# Patient Record
Sex: Male | Born: 1935 | Race: White | Hispanic: No | Marital: Single | Smoking: Former smoker
Health system: Southern US, Community
[De-identification: ages and names within clinical notes are randomized; demographics above are authoritative.]

## PROBLEM LIST (undated history)

## (undated) DIAGNOSIS — N179 Acute kidney failure, unspecified: Secondary | ICD-10-CM

## (undated) DIAGNOSIS — I714 Abdominal aortic aneurysm, without rupture, unspecified: Secondary | ICD-10-CM

## (undated) DIAGNOSIS — J449 Chronic obstructive pulmonary disease, unspecified: Secondary | ICD-10-CM

## (undated) DIAGNOSIS — K56609 Unspecified intestinal obstruction, unspecified as to partial versus complete obstruction: Secondary | ICD-10-CM

## (undated) DIAGNOSIS — J45909 Unspecified asthma, uncomplicated: Secondary | ICD-10-CM

## (undated) DIAGNOSIS — I4891 Unspecified atrial fibrillation: Secondary | ICD-10-CM

## (undated) DIAGNOSIS — Z720 Tobacco use: Secondary | ICD-10-CM

## (undated) DIAGNOSIS — F32A Depression, unspecified: Secondary | ICD-10-CM

## (undated) DIAGNOSIS — R9431 Abnormal electrocardiogram [ECG] [EKG]: Secondary | ICD-10-CM

## (undated) DIAGNOSIS — R112 Nausea with vomiting, unspecified: Secondary | ICD-10-CM

## (undated) DIAGNOSIS — R32 Unspecified urinary incontinence: Secondary | ICD-10-CM

## (undated) DIAGNOSIS — F0390 Unspecified dementia without behavioral disturbance: Secondary | ICD-10-CM

## (undated) DIAGNOSIS — M1990 Unspecified osteoarthritis, unspecified site: Secondary | ICD-10-CM

## (undated) DIAGNOSIS — F329 Major depressive disorder, single episode, unspecified: Secondary | ICD-10-CM

## (undated) DIAGNOSIS — R7989 Other specified abnormal findings of blood chemistry: Secondary | ICD-10-CM

## (undated) DIAGNOSIS — I2781 Cor pulmonale (chronic): Secondary | ICD-10-CM

## (undated) HISTORY — DX: Unspecified osteoarthritis, unspecified site: M19.90

## (undated) HISTORY — DX: Unspecified asthma, uncomplicated: J45.909

## (undated) HISTORY — DX: Depression, unspecified: F32.A

## (undated) HISTORY — DX: Unspecified dementia, unspecified severity, without behavioral disturbance, psychotic disturbance, mood disturbance, and anxiety: F03.90

## (undated) HISTORY — DX: Unspecified urinary incontinence: R32

## (undated) HISTORY — DX: Unspecified intestinal obstruction, unspecified as to partial versus complete obstruction: K56.609

## (undated) HISTORY — DX: Cor pulmonale (chronic): I27.81

## (undated) HISTORY — DX: Abdominal aortic aneurysm, without rupture: I71.4

## (undated) HISTORY — DX: Abdominal aortic aneurysm, without rupture, unspecified: I71.40

## (undated) HISTORY — DX: Acute kidney failure, unspecified: N17.9

## (undated) HISTORY — DX: Tobacco use: Z72.0

## (undated) HISTORY — DX: Major depressive disorder, single episode, unspecified: F32.9

## (undated) HISTORY — DX: Nausea with vomiting, unspecified: R11.2

## (undated) HISTORY — DX: Chronic obstructive pulmonary disease, unspecified: J44.9

---

## 2015-09-17 DIAGNOSIS — J449 Chronic obstructive pulmonary disease, unspecified: Secondary | ICD-10-CM | POA: Diagnosis not present

## 2015-09-17 DIAGNOSIS — R5382 Chronic fatigue, unspecified: Secondary | ICD-10-CM | POA: Diagnosis not present

## 2015-09-17 DIAGNOSIS — H1011 Acute atopic conjunctivitis, right eye: Secondary | ICD-10-CM | POA: Diagnosis not present

## 2015-09-17 DIAGNOSIS — F0390 Unspecified dementia without behavioral disturbance: Secondary | ICD-10-CM | POA: Diagnosis not present

## 2015-09-17 DIAGNOSIS — M1990 Unspecified osteoarthritis, unspecified site: Secondary | ICD-10-CM | POA: Diagnosis not present

## 2015-09-17 DIAGNOSIS — R5383 Other fatigue: Secondary | ICD-10-CM | POA: Diagnosis not present

## 2015-09-17 DIAGNOSIS — H1013 Acute atopic conjunctivitis, bilateral: Secondary | ICD-10-CM | POA: Diagnosis not present

## 2015-09-17 DIAGNOSIS — R05 Cough: Secondary | ICD-10-CM | POA: Diagnosis not present

## 2015-10-23 ENCOUNTER — Encounter (HOSPITAL_COMMUNITY): Payer: Self-pay | Admitting: Emergency Medicine

## 2015-10-23 ENCOUNTER — Ambulatory Visit (INDEPENDENT_AMBULATORY_CARE_PROVIDER_SITE_OTHER): Payer: Medicare Other

## 2015-10-23 ENCOUNTER — Emergency Department (HOSPITAL_COMMUNITY): Payer: Medicare Other

## 2015-10-23 ENCOUNTER — Inpatient Hospital Stay (HOSPITAL_COMMUNITY): Payer: Medicare Other

## 2015-10-23 ENCOUNTER — Inpatient Hospital Stay (HOSPITAL_COMMUNITY)
Admission: EM | Admit: 2015-10-23 | Discharge: 2015-10-28 | DRG: 389 | Disposition: A | Discharge status: Skilled Nursing Facility | Payer: Medicare Other | Attending: Family Medicine | Admitting: Family Medicine

## 2015-10-23 ENCOUNTER — Ambulatory Visit (INDEPENDENT_AMBULATORY_CARE_PROVIDER_SITE_OTHER): Payer: Medicare Other | Admitting: Physician Assistant

## 2015-10-23 VITALS — BP 122/89 | HR 132 | Temp 97.8°F | Ht 65.0 in | Wt 124.0 lb

## 2015-10-23 DIAGNOSIS — Z789 Other specified health status: Secondary | ICD-10-CM | POA: Diagnosis not present

## 2015-10-23 DIAGNOSIS — R52 Pain, unspecified: Secondary | ICD-10-CM

## 2015-10-23 DIAGNOSIS — R1084 Generalized abdominal pain: Secondary | ICD-10-CM

## 2015-10-23 DIAGNOSIS — F329 Major depressive disorder, single episode, unspecified: Secondary | ICD-10-CM | POA: Diagnosis not present

## 2015-10-23 DIAGNOSIS — E872 Acidosis, unspecified: Secondary | ICD-10-CM

## 2015-10-23 DIAGNOSIS — K219 Gastro-esophageal reflux disease without esophagitis: Secondary | ICD-10-CM | POA: Diagnosis not present

## 2015-10-23 DIAGNOSIS — I4891 Unspecified atrial fibrillation: Secondary | ICD-10-CM

## 2015-10-23 DIAGNOSIS — I714 Abdominal aortic aneurysm, without rupture, unspecified: Secondary | ICD-10-CM | POA: Diagnosis present

## 2015-10-23 DIAGNOSIS — R631 Polydipsia: Secondary | ICD-10-CM | POA: Diagnosis not present

## 2015-10-23 DIAGNOSIS — R109 Unspecified abdominal pain: Secondary | ICD-10-CM | POA: Diagnosis not present

## 2015-10-23 DIAGNOSIS — F0390 Unspecified dementia without behavioral disturbance: Secondary | ICD-10-CM | POA: Insufficient documentation

## 2015-10-23 DIAGNOSIS — R112 Nausea with vomiting, unspecified: Secondary | ICD-10-CM

## 2015-10-23 DIAGNOSIS — Z720 Tobacco use: Secondary | ICD-10-CM | POA: Diagnosis present

## 2015-10-23 DIAGNOSIS — R739 Hyperglycemia, unspecified: Secondary | ICD-10-CM | POA: Diagnosis not present

## 2015-10-23 DIAGNOSIS — R110 Nausea: Secondary | ICD-10-CM

## 2015-10-23 DIAGNOSIS — K565 Intestinal adhesions [bands] with obstruction (postprocedural) (postinfection): Secondary | ICD-10-CM | POA: Diagnosis not present

## 2015-10-23 DIAGNOSIS — G4700 Insomnia, unspecified: Secondary | ICD-10-CM | POA: Insufficient documentation

## 2015-10-23 DIAGNOSIS — Z01810 Encounter for preprocedural cardiovascular examination: Secondary | ICD-10-CM | POA: Diagnosis not present

## 2015-10-23 DIAGNOSIS — E860 Dehydration: Secondary | ICD-10-CM | POA: Diagnosis not present

## 2015-10-23 DIAGNOSIS — I744 Embolism and thrombosis of arteries of extremities, unspecified: Secondary | ICD-10-CM | POA: Diagnosis not present

## 2015-10-23 DIAGNOSIS — N390 Urinary tract infection, site not specified: Secondary | ICD-10-CM | POA: Diagnosis present

## 2015-10-23 DIAGNOSIS — I272 Other secondary pulmonary hypertension: Secondary | ICD-10-CM | POA: Diagnosis not present

## 2015-10-23 DIAGNOSIS — I2781 Cor pulmonale (chronic): Secondary | ICD-10-CM | POA: Insufficient documentation

## 2015-10-23 DIAGNOSIS — F17200 Nicotine dependence, unspecified, uncomplicated: Secondary | ICD-10-CM | POA: Insufficient documentation

## 2015-10-23 DIAGNOSIS — F17210 Nicotine dependence, cigarettes, uncomplicated: Secondary | ICD-10-CM | POA: Diagnosis present

## 2015-10-23 DIAGNOSIS — R4182 Altered mental status, unspecified: Secondary | ICD-10-CM

## 2015-10-23 DIAGNOSIS — D72829 Elevated white blood cell count, unspecified: Secondary | ICD-10-CM

## 2015-10-23 DIAGNOSIS — N179 Acute kidney failure, unspecified: Secondary | ICD-10-CM | POA: Diagnosis present

## 2015-10-23 DIAGNOSIS — K5669 Other intestinal obstruction: Secondary | ICD-10-CM | POA: Diagnosis not present

## 2015-10-23 DIAGNOSIS — N401 Enlarged prostate with lower urinary tract symptoms: Secondary | ICD-10-CM | POA: Diagnosis not present

## 2015-10-23 DIAGNOSIS — R079 Chest pain, unspecified: Secondary | ICD-10-CM | POA: Diagnosis not present

## 2015-10-23 DIAGNOSIS — A419 Sepsis, unspecified organism: Secondary | ICD-10-CM

## 2015-10-23 DIAGNOSIS — K5660 Unspecified intestinal obstruction: Secondary | ICD-10-CM | POA: Diagnosis not present

## 2015-10-23 DIAGNOSIS — R7989 Other specified abnormal findings of blood chemistry: Secondary | ICD-10-CM

## 2015-10-23 DIAGNOSIS — J449 Chronic obstructive pulmonary disease, unspecified: Secondary | ICD-10-CM | POA: Diagnosis present

## 2015-10-23 DIAGNOSIS — R350 Frequency of micturition: Secondary | ICD-10-CM | POA: Insufficient documentation

## 2015-10-23 DIAGNOSIS — K598 Other specified functional intestinal disorders: Secondary | ICD-10-CM | POA: Diagnosis not present

## 2015-10-23 DIAGNOSIS — K56609 Unspecified intestinal obstruction, unspecified as to partial versus complete obstruction: Secondary | ICD-10-CM | POA: Diagnosis present

## 2015-10-23 DIAGNOSIS — R9431 Abnormal electrocardiogram [ECG] [EKG]: Secondary | ICD-10-CM | POA: Diagnosis not present

## 2015-10-23 DIAGNOSIS — I480 Paroxysmal atrial fibrillation: Secondary | ICD-10-CM | POA: Diagnosis not present

## 2015-10-23 DIAGNOSIS — R9439 Abnormal result of other cardiovascular function study: Secondary | ICD-10-CM | POA: Diagnosis not present

## 2015-10-23 DIAGNOSIS — E876 Hypokalemia: Secondary | ICD-10-CM | POA: Diagnosis not present

## 2015-10-23 DIAGNOSIS — I509 Heart failure, unspecified: Secondary | ICD-10-CM | POA: Diagnosis not present

## 2015-10-23 DIAGNOSIS — R778 Other specified abnormalities of plasma proteins: Secondary | ICD-10-CM | POA: Diagnosis present

## 2015-10-23 DIAGNOSIS — K2970 Gastritis, unspecified, without bleeding: Secondary | ICD-10-CM | POA: Diagnosis not present

## 2015-10-23 HISTORY — DX: Other specified abnormal findings of blood chemistry: R79.89

## 2015-10-23 HISTORY — DX: Unspecified atrial fibrillation: I48.91

## 2015-10-23 HISTORY — DX: Abnormal electrocardiogram (ECG) (EKG): R94.31

## 2015-10-23 LAB — COMPREHENSIVE METABOLIC PANEL
ALT: 12 U/L — AB (ref 17–63)
AST: 25 U/L (ref 15–41)
Albumin: 4.1 g/dL (ref 3.5–5.0)
Alkaline Phosphatase: 75 U/L (ref 38–126)
Anion gap: 23 — ABNORMAL HIGH (ref 5–15)
BUN: 86 mg/dL — ABNORMAL HIGH (ref 6–20)
CHLORIDE: 85 mmol/L — AB (ref 101–111)
CO2: 25 mmol/L (ref 22–32)
CREATININE: 3.39 mg/dL — AB (ref 0.61–1.24)
Calcium: 9.7 mg/dL (ref 8.9–10.3)
GFR calc non Af Amer: 15 mL/min — ABNORMAL LOW (ref >60)
GFR, EST AFRICAN AMERICAN: 18 mL/min — AB (ref >60)
Glucose, Bld: 149 mg/dL — ABNORMAL HIGH (ref 65–99)
POTASSIUM: 3.8 mmol/L (ref 3.5–5.1)
SODIUM: 133 mmol/L — AB (ref 135–145)
Total Bilirubin: 2.2 mg/dL — ABNORMAL HIGH (ref 0.3–1.2)
Total Protein: 7.2 g/dL (ref 6.5–8.1)

## 2015-10-23 LAB — I-STAT CG4 LACTIC ACID, ED
LACTIC ACID, VENOUS: 3.35 mmol/L — AB (ref 0.5–2.0)
Lactic Acid, Venous: 3.76 mmol/L (ref 0.5–2.0)

## 2015-10-23 LAB — URINALYSIS, ROUTINE W REFLEX MICROSCOPIC
Bilirubin Urine: NEGATIVE
GLUCOSE, UA: NEGATIVE mg/dL
HGB URINE DIPSTICK: NEGATIVE
Ketones, ur: NEGATIVE mg/dL
Nitrite: NEGATIVE
PH: 5 (ref 5.0–8.0)
Protein, ur: NEGATIVE mg/dL
SPECIFIC GRAVITY, URINE: 1.02 (ref 1.005–1.030)

## 2015-10-23 LAB — URINE MICROSCOPIC-ADD ON

## 2015-10-23 LAB — POCT CBC
GRANULOCYTE PERCENT: 91.8 % — AB (ref 37–80)
HEMATOCRIT: 50.4 % (ref 43.5–53.7)
Hemoglobin: 17.7 g/dL (ref 14.1–18.1)
Lymph, poc: 1 (ref 0.6–3.4)
MCH: 32.2 pg — AB (ref 27–31.2)
MCHC: 35.2 g/dL (ref 31.8–35.4)
MCV: 91.6 fL (ref 80–97)
MID (CBC): 0.7 (ref 0–0.9)
MPV: 8.3 fL (ref 0–99.8)
PLATELET COUNT, POC: 237 10*3/uL (ref 142–424)
POC GRANULOCYTE: 18.5 — AB (ref 2–6.9)
POC LYMPH %: 4.9 % — AB (ref 10–50)
POC MID %: 3.3 %M (ref 0–12)
RBC: 5.5 M/uL (ref 4.69–6.13)
RDW, POC: 14.5 %
WBC: 20.2 10*3/uL — AB (ref 4.6–10.2)

## 2015-10-23 LAB — SODIUM, URINE, RANDOM: Sodium, Ur: 15 mmol/L

## 2015-10-23 LAB — CBC
HEMATOCRIT: 51.6 % (ref 39.0–52.0)
HEMOGLOBIN: 17.7 g/dL — AB (ref 13.0–17.0)
MCH: 31.3 pg (ref 26.0–34.0)
MCHC: 34.3 g/dL (ref 30.0–36.0)
MCV: 91.3 fL (ref 78.0–100.0)
PLATELETS: 228 10*3/uL (ref 150–400)
RBC: 5.65 MIL/uL (ref 4.22–5.81)
RDW: 14.6 % (ref 11.5–15.5)
WBC: 19.6 10*3/uL — ABNORMAL HIGH (ref 4.0–10.5)

## 2015-10-23 LAB — I-STAT CHEM 8, ED
BUN: 69 mg/dL — AB (ref 6–20)
CREATININE: 2.6 mg/dL — AB (ref 0.61–1.24)
Calcium, Ion: 0.87 mmol/L — ABNORMAL LOW (ref 1.13–1.30)
Chloride: 95 mmol/L — ABNORMAL LOW (ref 101–111)
Glucose, Bld: 119 mg/dL — ABNORMAL HIGH (ref 65–99)
HEMATOCRIT: 51 % (ref 39.0–52.0)
HEMOGLOBIN: 17.3 g/dL — AB (ref 13.0–17.0)
POTASSIUM: 3.4 mmol/L — AB (ref 3.5–5.1)
SODIUM: 133 mmol/L — AB (ref 135–145)
TCO2: 23 mmol/L (ref 0–100)

## 2015-10-23 LAB — LACTIC ACID, PLASMA
LACTIC ACID, VENOUS: 1.8 mmol/L (ref 0.5–2.0)
Lactic Acid, Venous: 2.1 mmol/L (ref 0.5–2.0)

## 2015-10-23 LAB — TROPONIN I: TROPONIN I: 0.06 ng/mL — AB (ref <0.031)

## 2015-10-23 LAB — GLUCOSE, POCT (MANUAL RESULT ENTRY): POC GLUCOSE: 152 mg/dL — AB (ref 70–99)

## 2015-10-23 LAB — MAGNESIUM: MAGNESIUM: 1.9 mg/dL (ref 1.7–2.4)

## 2015-10-23 LAB — LIPASE, BLOOD: LIPASE: 25 U/L (ref 11–51)

## 2015-10-23 LAB — PHOSPHORUS: PHOSPHORUS: 4.3 mg/dL (ref 2.5–4.6)

## 2015-10-23 LAB — CREATININE, URINE, RANDOM: Creatinine, Urine: 133.94 mg/dL

## 2015-10-23 MED ORDER — DEXTROSE-NACL 5-0.45 % IV SOLN
INTRAVENOUS | Status: AC
  Administered 2015-10-23: via INTRAVENOUS

## 2015-10-23 MED ORDER — ONDANSETRON HCL 4 MG/2ML IJ SOLN: 4.0000 mg | Freq: Four times a day (QID) | INTRAMUSCULAR | Status: DC | PRN

## 2015-10-23 MED ORDER — MORPHINE SULFATE (PF) 2 MG/ML IV SOLN: 2.0000 mg | INTRAVENOUS | Status: DC | PRN

## 2015-10-23 MED ORDER — ONDANSETRON HCL 4 MG/2ML IJ SOLN
4.0000 mg | Freq: Once | INTRAMUSCULAR | Status: AC
  Administered 2015-10-23: 4 mg via INTRAVENOUS
  Filled 2015-10-23: qty 2

## 2015-10-23 MED ORDER — IOHEXOL 300 MG/ML  SOLN
25.0000 mL | Freq: Once | INTRAMUSCULAR | Status: AC | PRN
  Administered 2015-10-23: 25 mL via ORAL

## 2015-10-23 MED ORDER — SODIUM CHLORIDE 0.9 % IV BOLUS (SEPSIS)
1000.0000 mL | INTRAVENOUS | Status: AC
Start: 2015-10-23 — End: 2015-10-23
  Administered 2015-10-23 (×2): 1000 mL via INTRAVENOUS

## 2015-10-23 MED ORDER — PIPERACILLIN-TAZOBACTAM 3.375 G IVPB 30 MIN
3.3750 g | Freq: Once | INTRAVENOUS | Status: AC
  Administered 2015-10-23: 3.375 g via INTRAVENOUS
  Filled 2015-10-23: qty 50

## 2015-10-23 MED ORDER — MORPHINE SULFATE (PF) 4 MG/ML IV SOLN
4.0000 mg | Freq: Once | INTRAVENOUS | Status: AC
  Administered 2015-10-23: 4 mg via INTRAVENOUS
  Filled 2015-10-23: qty 1

## 2015-10-23 MED ORDER — PIPERACILLIN-TAZOBACTAM IN DEX 2-0.25 GM/50ML IV SOLN
2.2500 g | Freq: Three times a day (TID) | INTRAVENOUS | Status: DC
  Administered 2015-10-24 – 2015-10-25 (×5): 2.25 g via INTRAVENOUS
  Filled 2015-10-23 (×6): qty 50

## 2015-10-23 MED ORDER — PANTOPRAZOLE SODIUM 40 MG IV SOLR
40.0000 mg | INTRAVENOUS | Status: DC
  Administered 2015-10-24 – 2015-10-26 (×4): 40 mg via INTRAVENOUS
  Filled 2015-10-23 (×4): qty 40

## 2015-10-23 MED ORDER — ONDANSETRON HCL 4 MG PO TABS: 4.0000 mg | ORAL_TABLET | Freq: Four times a day (QID) | ORAL | Status: DC | PRN

## 2015-10-23 MED ORDER — HEPARIN (PORCINE) IN NACL 100-0.45 UNIT/ML-% IJ SOLN
1300.0000 [IU]/h | INTRAMUSCULAR | Status: DC
  Administered 2015-10-23: 750 [IU]/h via INTRAVENOUS
  Administered 2015-10-24: 1000 [IU]/h via INTRAVENOUS
  Administered 2015-10-26: 950 [IU]/h via INTRAVENOUS
  Filled 2015-10-23 (×3): qty 250

## 2015-10-23 MED ORDER — MAGNESIUM SULFATE IN D5W 10-5 MG/ML-% IV SOLN
1.0000 g | Freq: Once | INTRAVENOUS | Status: AC
  Administered 2015-10-24: 1 g via INTRAVENOUS
  Filled 2015-10-23 (×2): qty 100

## 2015-10-23 MED ORDER — HEPARIN BOLUS VIA INFUSION
2500.0000 [IU] | Freq: Once | INTRAVENOUS | Status: AC
  Administered 2015-10-23: 2500 [IU] via INTRAVENOUS
  Filled 2015-10-23: qty 2500

## 2015-10-23 NOTE — ED Notes (Signed)
Unsuccessful attempt for NGT placement with Vance PeperLynnze, RN as assist.

## 2015-10-23 NOTE — Progress Notes (Signed)
ANTICOAGULATION CONSULT NOTE - Initial Consult  Pharmacy Consult for heparin  Indication: atrial fibrillation  No Known Allergies  Patient Measurements: Weight: 120 lb 3.2 oz (54.522 kg)  Vital Signs: Temp: 97.7 F (36.5 C) (03/11 1630) Temp Source: Oral (03/11 1630) BP: 104/84 mmHg (03/11 2000) Pulse Rate: 102 (03/11 1904)  Labs:  Recent Labs  10/23/15 1421 10/23/15 1628  HGB 17.7 17.7*  HCT 50.4 51.6  PLT  --  228  CREATININE  --  3.39*    Estimated Creatinine Clearance: 12.5 mL/min (by C-G formula based on Cr of 3.39).   Medical History: Past Medical History  Diagnosis Date  . Asthma   . Arthritis   . Depression   . Dementia   . Incontinence     Assessment: [age] yoM admitted with abdominal pain and found to be in afib. Pharmacy consulted to dose heparin. No known anticoagulation PTA, CBC stable.  Goal of Therapy:  Heparin level 0.3-0.7 units/ml Monitor platelets by anticoagulation protocol: Yes   Plan:  1. Give 2500 units bolus x 1 2. Start heparin infusion at 750 units/hr 3. Check anti-Xa level in 8 hours and daily while on heparin 4. Continue to monitor H&H and platelets  Pollyann SamplesAndy Stephen Baruch, PharmD, BCPS 10/23/2015, 8:24 PM Pager: [phone]

## 2015-10-23 NOTE — ED Notes (Signed)
Attempted to assist patient to void, unsucessful. Preparing for in and out cath.

## 2015-10-23 NOTE — Consult Note (Signed)
Reason for Consult:Small bowel obstruction Referring Physician: Kamdyn Colborn is an 82 y.o. male.  HPI: Patient with no known history of prior surgry, acute onset of nausea and vomiting for today and severe abdominal pain which has subsided recentl.  Given pain medications in the ED and also anti-nausea medications.  Surgery called for consultation  Past Medical History  Diagnosis Date  . Asthma   . Arthritis   . Depression   . Dementia   . Incontinence     History reviewed. No pertinent past surgical history.  History reviewed. No pertinent family history.  Social History:  reports that he has been smoking Cigarettes.  He has a 60 pack-year smoking history. He has never used smokeless tobacco. He reports that he does not drink alcohol or use illicit drugs.  Allergies: No Known Allergies  Medications: Patient not on any medication prior to admission  Results for orders placed or performed during the hospital encounter of 10/23/15 (from the past 48 hour(s))  Lipase, blood     Status: None   Collection Time: 10/23/15  4:28 PM  Result Value Ref Range   Lipase 25 11 - 51 U/L  Comprehensive metabolic panel     Status: Abnormal   Collection Time: 10/23/15  4:28 PM  Result Value Ref Range   Sodium 133 (L) 135 - 145 mmol/L   Potassium 3.8 3.5 - 5.1 mmol/L   Chloride 85 (L) 101 - 111 mmol/L   CO2 25 22 - 32 mmol/L   Glucose, Bld 149 (H) 65 - 99 mg/dL   BUN 86 (H) 6 - 20 mg/dL   Creatinine, Ser 3.39 (H) 0.61 - 1.24 mg/dL   Calcium 9.7 8.9 - 10.3 mg/dL   Total Protein 7.2 6.5 - 8.1 g/dL   Albumin 4.1 3.5 - 5.0 g/dL   AST 25 15 - 41 U/L   ALT 12 (L) 17 - 63 U/L   Alkaline Phosphatase 75 38 - 126 U/L   Total Bilirubin 2.2 (H) 0.3 - 1.2 mg/dL   GFR calc non Af Amer 15 (L) >60 mL/min   GFR calc Af Amer 18 (L) >60 mL/min    Comment: (NOTE) The eGFR has been calculated using the CKD EPI equation. This calculation has not been validated in all clinical situations. eGFR's  persistently <60 mL/min signify possible Chronic Kidney Disease.    Anion gap 23 (H) 5 - 15  CBC     Status: Abnormal   Collection Time: 10/23/15  4:28 PM  Result Value Ref Range   WBC 19.6 (H) 4.0 - 10.5 K/uL   RBC 5.65 4.22 - 5.81 MIL/uL   Hemoglobin 17.7 (H) 13.0 - 17.0 g/dL   HCT 51.6 39.0 - 52.0 %   MCV 91.3 78.0 - 100.0 fL   MCH 31.3 26.0 - 34.0 pg   MCHC 34.3 30.0 - 36.0 g/dL   RDW 14.6 11.5 - 15.5 %   Platelets 228 150 - 400 K/uL  I-Stat CG4 Lactic Acid, ED (Not at [facility])     Status: Abnormal   Collection Time: 10/23/15  4:42 PM  Result Value Ref Range   Lactic Acid, Venous 3.76 (HH) 0.5 - 2.0 mmol/L   Comment NOTIFIED PHYSICIAN   I-stat chem 8, ed     Status: Abnormal   Collection Time: 10/23/15  8:24 PM  Result Value Ref Range   Sodium 133 (L) 135 - 145 mmol/L   Potassium 3.4 (L) 3.5 - 5.1 mmol/L   Chloride  95 (L) 101 - 111 mmol/L   BUN 69 (H) 6 - 20 mg/dL   Creatinine, Ser 2.60 (H) 0.61 - 1.24 mg/dL   Glucose, Bld 119 (H) 65 - 99 mg/dL   Calcium, Ion 0.87 (L) 1.13 - 1.30 mmol/L   TCO2 23 0 - 100 mmol/L   Hemoglobin 17.3 (H) 13.0 - 17.0 g/dL   HCT 51.0 39.0 - 52.0 %    Ct Abdomen Pelvis Wo Contrast  10/23/2015  CLINICAL DATA:  Nausea and vomiting with generalized abdominal pain. EXAM: CT ABDOMEN AND PELVIS WITHOUT CONTRAST TECHNIQUE: Multidetector CT imaging of the abdomen and pelvis was performed following the standard protocol without IV contrast. COMPARISON:  Acute abdomen series from earlier today. FINDINGS: Lower chest: Emphysema noted in the lung bases with bronchial wall thickening and dependent atelectasis. Hepatobiliary: No focal abnormality in the liver on this study without intravenous contrast. No evidence of hepatomegaly. Gallbladder is distended. No intrahepatic or extrahepatic biliary dilation. Pancreas: No focal mass lesion. No dilatation of the main duct. No intraparenchymal cyst. No peripancreatic edema. Spleen: No splenomegaly. No focal mass lesion.  Adrenals/Urinary Tract: No adrenal nodule or mass. No renal stones or hydronephrosis. No definite renal mass lesion on this study without intravenous contrast material. No evidence for hydroureter. The urinary bladder appears normal for the degree of distention. Stomach/Bowel: Mild circumferential wall thickening is seen in the distal esophagus and there is a small hiatal hernia. Stomach otherwise unremarkable. Duodenum is normally positioned as is the ligament of Treitz. Proximal jejunal loops are opacified and not substantially dilated. However, mid small bowel is air-filled and dilated up to 4.2 cm in diameter. The distal ileal loops are completely decompressed and the terminal ileum is visible on image 41 series 21. No definite transition zone can be identified. The colon is largely decompressed throughout. No evidence for bowel wall thickening. No pneumatosis. Vascular/Lymphatic: Abdominal aortic atherosclerosis noted with infrarenal abdominal aorta measuring up to 3.4 cm in diameter. There is no gastrohepatic or hepatoduodenal ligament lymphadenopathy. No intraperitoneal or retroperitoneal lymphadenopathy. No pelvic sidewall lymphadenopathy. Reproductive: The prostate gland and seminal vesicles have normal imaging features. Other: No intraperitoneal free fluid. No evidence for interloop mesenteric fluid Musculoskeletal: Patient is status post ORIF for proximal left femur fracture. Bones are diffusely demineralized Bone windows reveal no worrisome lytic or sclerotic osseous lesions. IMPRESSION: 1. Dilated small bowel up to 4.2 cm compatible with small bowel obstruction. The point of obstruction is probably in the mid to distal small bowel although a discrete transition zone cannot be identified on the current exam. There is no associated pneumatosis or bowel wall thickening. No interloop mesenteric fluid. 2. Circumferential wall thickening in the distal esophagus associated with small hiatal hernia. Changes in  the esophagus may be related and reflux. 3. 3.4 cm abdominal aortic aneurysm. Recommend followup by ultrasound in 3 years. This recommendation follows ACR consensus guidelines: White Paper of the ACR Incidental Findings Committee II on Vascular Findings. [facility]iol 2013; 10:789-794 Electronically Signed   By: Misty Stanley M.D.   On: 10/23/2015 18:57   Dg Abd Acute W/chest  10/23/2015  CLINICAL DATA:  82 year old male with history of abdominal pain. EXAM: DG ABDOMEN ACUTE W/ 1V CHEST COMPARISON:  No priors. FINDINGS: Several small dense nodules in the right hemithorax are most compatible with calcified granulomas. Bilateral nipple shadows incidentally noted. No acute consolidative airspace disease. No pleural effusions. No evidence of pulmonary edema. No pneumothorax. Heart size is normal. Upper mediastinal contours  are within normal limits. Atherosclerosis in the thoracic aorta. Numerous dilated loops of small bowel measuring up to 6.3 cm in diameter. There is a small amount of colonic and rectal gas and stool noted. No frank pneumoperitoneum confidently identified. Orthopedic fixation hardware in the left hip incompletely visualize. Numerous vascular calcifications are noted. IMPRESSION: 1. Appearance of the abdomen is compatible with small bowel obstruction, as above. Given the presence of gas and stool in the colon, obstruction may be early, or could be partial. 2. No pneumoperitoneum. 3. No radiographic evidence of acute cardiopulmonary disease. 4. Atherosclerosis. Electronically Signed   By: Vinnie Langton M.D.   On: 10/23/2015 14:39    Review of Systems  Constitutional: Negative for fever and chills.  Eyes: Negative.   Respiratory: Negative.   Cardiovascular: Negative.   Gastrointestinal: Positive for nausea, vomiting and abdominal pain.  Genitourinary: Negative.   Musculoskeletal: Negative.   Skin: Negative.   Neurological: Negative.   Endo/Heme/Allergies: Negative.    Psychiatric/Behavioral: Negative.   All other systems reviewed and are negative.  Blood pressure 100/69, pulse 102, temperature 97.7 F (36.5 C), temperature source Oral, resp. rate 15, weight 54.522 kg (120 lb 3.2 oz), SpO2 96 %. Physical Exam  Constitutional: He appears well-developed and well-nourished.  Slim but not cachetic  HENT:  Head: Normocephalic and atraumatic.  Eyes: Conjunctivae and EOM are normal. Pupils are equal, round, and reactive to light.  Neck: Normal range of motion. Neck supple.  Cardiovascular: An irregularly irregular rhythm present. Tachycardia present.   Pulses:      Carotid pulses are 2+ on the right side, and 2+ on the left side.      Radial pulses are 2+ on the right side, and 2+ on the left side.       Posterior tibial pulses are 2+ on the right side, and 2+ on the left side.  Respiratory: Effort normal and breath sounds normal.  GI: Soft. He exhibits distension. Bowel sounds are decreased. There is no tenderness. There is no rebound and no guarding.  Musculoskeletal: Normal range of motion.  Skin: Skin is warm and dry.  Psychiatric: He has a normal mood and affect. His behavior is normal. Judgment and thought content normal.    Assessment/Plan: New onset atrial fibrillation New onset bowel obstruction without know history of prior operation.  Needs NGT and possibly surgery in the near future.  Although his WBC was elevated and he has a moderately elevated lactic acid level, I do not believe that the patient is septic.  He needs acute resuscitation, IV hydration, pain control, and an NGT.  We will reassess the patient in the AM  [facility] Nimesh Riolo 10/23/2015, 8:51 PM

## 2015-10-23 NOTE — ED Notes (Signed)
Spoke to teresa in regards to repeat lactic.

## 2015-10-23 NOTE — ED Notes (Signed)
[facility] reports pt has bowel obstruction per x-ray.

## 2015-10-23 NOTE — Progress Notes (Signed)
Subjective:    Patient ID: Jesse Barber, male    DOB: 23-Jun-1929, 82 y.o.   MRN: [id number]  Chief Complaint  Patient presents with  . Emesis  . Cough  . Shortness of Breath   HPI  Jesse Barber presents today for an evaluation of 4 days of N, V, and abdominal pain.   He speaks serbian and is accompanied with his son and daughter in law who are translating. PMH of dementia, incontinence, and depression.   Family states that Wed (10/20/15) he complained of a LLQ abdominal pain than RLQ pain. Also states he became very thirsty Wednesday, always asking for water. Has had some incontinence for a few years but family states it has gotten worse the last few days. He tells them he is dry but they can smell he isn't. Pt denies pain with urination.   Family states he hasn't eaten anything since Wednesday. Pt states he's not hungry, just wants water to drink. When he drinks water he gets nauseous and immediately vomits it back up. The vomit looks like water with some yellow/green spots to it. Denies hematemesis. Reports some dizziness after vomiting. Thinks his last bowel movement was yesterday and was normal. Family is unsure if this is correct.  Family also states he's been getting more confused since Wednesday night. They state he has some dementia but that he has gotten much worse the lst few days. He looks off into space or at things that aren't there. He's "saying weird, crazy things."    Family states that sometimes his fingers and his face look yellow.   Denies chest pain. States he has a cough but is normal for him. When asked about SOB patient shakes his head but family says they think he might be.   Smokes a pack a day for 60 years. Had a check up last year and family reports received CXR which was normal.   PMH, FH, and SH were all reviewed with patient and updated as needed.     Prior to Admission medications   Medication Sig Start Date End Date Taking? Authorizing Provider    amitriptyline (ELAVIL) 25 MG tablet Take 25 mg by mouth at bedtime.   Yes Historical Provider, MD  donepezil (ARICEPT) 5 MG tablet Take 5 mg by mouth at bedtime.   Yes Historical Provider, MD  solifenacin (VESICARE) 10 MG tablet Take by mouth daily.   Yes Historical Provider, MD    Review of Systems  Constitutional: Positive for fever.  Respiratory: Positive for shortness of breath. Negative for cough and chest tightness.   Cardiovascular: Negative for chest pain.  Gastrointestinal: Positive for nausea, vomiting, abdominal pain and abdominal distention. Negative for diarrhea and constipation.  Endocrine: Positive for polydipsia and polyuria.  Genitourinary: Negative for dysuria and hematuria.  Neurological: Positive for dizziness and light-headedness. Negative for headaches.       Objective:   Physical Exam  Constitutional: He appears cachectic. He has a sickly appearance. He appears ill.  Blood pressure 122/89, pulse 132, temperature 97.8 F (36.6 C), temperature source Axillary, height 5' 5"  (1.651 m), weight 124 lb (56.246 kg).   HENT:  Head: Normocephalic and atraumatic.  Mouth/Throat: Oropharynx is clear and moist. No oropharyngeal exudate.  Eyes: Conjunctivae and EOM are normal. Pupils are equal, round, and reactive to light. No scleral icterus.  Cardiovascular: An irregularly irregular rhythm present. Tachycardia present.   Pulses:      Radial pulses are 2+ on the right side, and  2+ on the left side.  Difficult to auscultate heart sounds due to rapid breathing.   Pulmonary/Chest: No accessory muscle usage. Tachypnea noted. He has no wheezes. He has no rhonchi. He has no rales.  Abdominal: He exhibits distension. Bowel sounds are decreased. There is tenderness in the right lower quadrant. There is no CVA tenderness.  Abdomen is distended. Diffuse tenderness to palpation but more so in the RLQ. Unable to determine rebound or rosvings due to mental status and language barrier.  Attempt to lie pt flat results in vomiting.   Lymphadenopathy:       Head (right side): No submental, no submandibular, no tonsillar, no preauricular, no posterior auricular and no occipital adenopathy present.       Head (left side): No submental, no submandibular, no tonsillar, no preauricular, no posterior auricular and no occipital adenopathy present.    He has no cervical adenopathy.       Right: No supraclavicular adenopathy present.       Left: No supraclavicular adenopathy present.  Neurological: He is alert. No sensory deficit.  Skin: Skin is warm and dry.  Psychiatric:  Difficult to appreciate due to language barrier.      Results for orders placed or performed in visit on 10/23/15  POCT CBC  Result Value Ref Range   WBC 20.2 (A) 4.6 - 10.2 K/uL   Lymph, poc 1.0 0.6 - 3.4   POC LYMPH PERCENT 4.9 (A) 10 - 50 %L   MID (cbc) 0.7 0 - 0.9   POC MID % 3.3 0 - 12 %M   POC Granulocyte 18.5 (A) 2 - 6.9   Granulocyte percent 91.8 (A) 37 - 80 %G   RBC 5.50 4.69 - 6.13 M/uL   Hemoglobin 17.7 14.1 - 18.1 g/dL   HCT, POC 50.4 43.5 - 53.7 %   MCV 91.6 80 - 97 fL   MCH, POC 32.2 (A) 27 - 31.2 pg   MCHC 35.2 31.8 - 35.4 g/dL   RDW, POC 14.5 %   Platelet Count, POC 237 142 - 424 K/uL   MPV 8.3 0 - 99.8 fL  POCT glucose (manual entry)  Result Value Ref Range   POC Glucose 152 (A) 70 - 99 mg/dl   Dg Abd Acute W/chest  10/23/2015  CLINICAL DATA:  82 year old male with history of abdominal pain. EXAM: DG ABDOMEN ACUTE W/ 1V CHEST COMPARISON:  No priors. FINDINGS: Several small dense nodules in the right hemithorax are most compatible with calcified granulomas. Bilateral nipple shadows incidentally noted. No acute consolidative airspace disease. No pleural effusions. No evidence of pulmonary edema. No pneumothorax. Heart size is normal. Upper mediastinal contours are within normal limits. Atherosclerosis in the thoracic aorta. Numerous dilated loops of small bowel measuring up to 6.3 cm in  diameter. There is a small amount of colonic and rectal gas and stool noted. No frank pneumoperitoneum confidently identified. Orthopedic fixation hardware in the left hip incompletely visualize. Numerous vascular calcifications are noted. IMPRESSION: 1. Appearance of the abdomen is compatible with small bowel obstruction, as above. Given the presence of gas and stool in the colon, obstruction may be early, or could be partial. 2. No pneumoperitoneum. 3. No radiographic evidence of acute cardiopulmonary disease. 4. Atherosclerosis. Electronically Signed   By: Vinnie Langton M.D.   On: 10/23/2015 14:39      Assessment & Plan:  Generalized abdominal pain - Plan: DG Abd Acute W/Chest  Nausea and vomiting, intractability of vomiting not specified, unspecified vomiting  type - Plan: POCT CBC  SBO (small bowel obstruction) (HCC)  Polydipsia - Plan: POCT glucose (manual entry), POCT urinalysis dipstick, POCT Microscopic Urinalysis ([facility])  Hyperglycemia  Altered mental status, unspecified altered mental status type - Plan: EKG 12-Lead, DG Abd Acute W/Chest  Mr. Laviolette is an 81 year old male who presents with a 4 day history of N, V, abdominal pain. Associated with increased confusion, polyuria, polydipsia, and urinary incontinence. Is tachypnic and with irregularly irregular pulse on exam. PE is also pertinent of a distended abdomen, with generalized tenderness and pain worse in the RLQ. CBC shows WBC of 20.2k and a large left shift. POCT glucose was also elevated at 152. EKG is pertinent for new onset atrial fibrilation with other changes noted. Abdominal series xrays are positive for small bowell obstruction.   Will call for transport to the ER via EMS for further evaluation.   Discussed assessment and plan with patient and his family. They expressed their understanding and acceptance.   [place]

## 2015-10-23 NOTE — Patient Instructions (Signed)
     IF you received an x-ray today, you will receive an invoice from [facility]. Please contact [facility] at [phone] with questions or concerns regarding your invoice.   IF you received labwork today, you will receive an invoice from [facility]. Please contact [facility] at [phone] with questions or concerns regarding your invoice.   Our billing staff will not be able to assist you with questions regarding bills from these companies.  You will be contacted with the lab results as soon as they are available. The fastest way to get your results is to activate your My Chart account. Instructions are located on the last page of this paperwork. If you have not heard from us regarding the results in 2 weeks, please contact this office.      

## 2015-10-23 NOTE — ED Notes (Signed)
Code sepsis 

## 2015-10-23 NOTE — ED Provider Notes (Signed)
CSN: [id number]     Arrival date & time 10/23/15  1531 History   First MD Initiated Contact with Patient 10/23/15 1540     Chief Complaint  Patient presents with  . Abdominal Pain  . Emesis     (Consider location/radiation/quality/duration/timing/severity/associated sxs/prior Treatment) HPI Comments: Pt comes in with cc of abd pain. Pt speaks [place], family helping - Nurse, learning disabilitytranslator utilized. PT reports that he started having abd pain to the R side and lower quadrants on Wednesday. The pain was off and on, but now is constant. Patient also started having nausea and emesis - more than 10 episodes, and the emesis is dark, foul smelling. Pt has poor appetite and thirsty. Pt thinks he is passing flatus and his last BM was yday. Family also thinks patient is more confused. PT is noted to be in afib here. There is hx of BPH, no other medical or surgical hx. Pt is healthy and active. No hx of colonoscopy. Pt sent here from [facility] - [facility] over there showed possible SBO.   ROS 10 Systems reviewed and are negative for acute change except as noted in the HPI.     Patient is a 82 y.o. male presenting with abdominal pain and vomiting. The history is provided by the patient. The history is limited by a language barrier. A language interpreter was used.  Abdominal Pain Associated symptoms: vomiting   Emesis Associated symptoms: abdominal pain     Past Medical History  Diagnosis Date  . Asthma   . Arthritis   . Depression   . Dementia   . Incontinence    History reviewed. No pertinent past surgical history. History reviewed. No pertinent family history. Social History  Substance Use Topics  . Smoking status: Current Every Day Smoker -- 1.00 packs/day for 60 years    Types: Cigarettes  . Smokeless tobacco: Never Used  . Alcohol Use: No     Comment: Quit drinking 2 years ago    Review of Systems  Gastrointestinal: Positive for vomiting and abdominal pain.      Allergies  Review of  patient's allergies indicates no known allergies.  Home Medications   Prior to Admission medications   Medication Sig Start Date End Date Taking? Authorizing Provider  amitriptyline (ELAVIL) 25 MG tablet Take 25 mg by mouth at bedtime.   Yes Historical Provider, MD  [employer] VIT D 5000 HIGH-POTENCY 5000 units capsule Take 5,000 Units by mouth daily. 10/09/15  Yes Historical Provider, MD  donepezil (ARICEPT) 5 MG tablet Take 5 mg by mouth at bedtime.   Yes Historical Provider, MD  solifenacin (VESICARE) 10 MG tablet Take by mouth daily.   Yes Historical Provider, MD   BP 104/84 mmHg  Pulse 102  Temp(Src) 97.7 F (36.5 C) (Oral)  Resp 16  Wt 120 lb 3.2 oz (54.522 kg)  SpO2 96% Physical Exam  Constitutional: He is oriented to person, place, and time. He appears well-developed.  HENT:  Head: Normocephalic and atraumatic.  Eyes: Conjunctivae and EOM are normal. Pupils are equal, round, and reactive to light.  Neck: Normal range of motion. Neck supple.  Cardiovascular: Normal heart sounds.   irregular  Pulmonary/Chest: Effort normal and breath sounds normal. No respiratory distress. He has no wheezes.  Abdominal: Soft. Bowel sounds are normal. He exhibits no distension. There is tenderness. There is rebound and guarding.  Generalized tenderness, worst in the RLQ and suprapubic region where there is localized guarding and rebound  Neurological: He is alert  and oriented to person, place, and time.  Skin: Skin is warm.  Nursing note and vitals reviewed.   ED Course  Procedures (including critical care time) Labs Review Labs Reviewed  COMPREHENSIVE METABOLIC PANEL - Abnormal; Notable for the following:    Sodium 133 (*)    Chloride 85 (*)    Glucose, Bld 149 (*)    BUN 86 (*)    Creatinine, Ser 3.39 (*)    ALT 12 (*)    Total Bilirubin 2.2 (*)    GFR calc non Af Amer 15 (*)    GFR calc Af Amer 18 (*)    Anion gap 23 (*)    All other components within normal limits  CBC - Abnormal;  Notable for the following:    WBC 19.6 (*)    Hemoglobin 17.7 (*)    All other components within normal limits  I-STAT CG4 LACTIC ACID, ED - Abnormal; Notable for the following:    Lactic Acid, Venous 3.76 (*)    All other components within normal limits  CULTURE, BLOOD (ROUTINE X 2)  CULTURE, BLOOD (ROUTINE X 2)  URINE CULTURE  LIPASE, BLOOD  URINALYSIS, ROUTINE W REFLEX MICROSCOPIC (NOT AT [facility])  SODIUM, URINE, RANDOM  CREATININE, URINE, RANDOM  LACTIC ACID, PLASMA  LACTIC ACID, PLASMA  MAGNESIUM  PHOSPHORUS  I-STAT CG4 LACTIC ACID, ED  I-STAT CG4 LACTIC ACID, ED    Imaging Review Ct Abdomen Pelvis Wo Contrast  10/23/2015  CLINICAL DATA:  Nausea and vomiting with generalized abdominal pain. EXAM: CT ABDOMEN AND PELVIS WITHOUT CONTRAST TECHNIQUE: Multidetector CT imaging of the abdomen and pelvis was performed following the standard protocol without IV contrast. COMPARISON:  Acute abdomen series from earlier today. FINDINGS: Lower chest: Emphysema noted in the lung bases with bronchial wall thickening and dependent atelectasis. Hepatobiliary: No focal abnormality in the liver on this study without intravenous contrast. No evidence of hepatomegaly. Gallbladder is distended. No intrahepatic or extrahepatic biliary dilation. Pancreas: No focal mass lesion. No dilatation of the main duct. No intraparenchymal cyst. No peripancreatic edema. Spleen: No splenomegaly. No focal mass lesion. Adrenals/Urinary Tract: No adrenal nodule or mass. No renal stones or hydronephrosis. No definite renal mass lesion on this study without intravenous contrast material. No evidence for hydroureter. The urinary bladder appears normal for the degree of distention. Stomach/Bowel: Mild circumferential wall thickening is seen in the distal esophagus and there is a small hiatal hernia. Stomach otherwise unremarkable. Duodenum is normally positioned as is the ligament of Treitz. Proximal jejunal loops are opacified and  not substantially dilated. However, mid small bowel is air-filled and dilated up to 4.2 cm in diameter. The distal ileal loops are completely decompressed and the terminal ileum is visible on image 41 series 21. No definite transition zone can be identified. The colon is largely decompressed throughout. No evidence for bowel wall thickening. No pneumatosis. Vascular/Lymphatic: Abdominal aortic atherosclerosis noted with infrarenal abdominal aorta measuring up to 3.4 cm in diameter. There is no gastrohepatic or hepatoduodenal ligament lymphadenopathy. No intraperitoneal or retroperitoneal lymphadenopathy. No pelvic sidewall lymphadenopathy. Reproductive: The prostate gland and seminal vesicles have normal imaging features. Other: No intraperitoneal free fluid. No evidence for interloop mesenteric fluid Musculoskeletal: Patient is status post ORIF for proximal left femur fracture. Bones are diffusely demineralized Bone windows reveal no worrisome lytic or sclerotic osseous lesions. IMPRESSION: 1. Dilated small bowel up to 4.2 cm compatible with small bowel obstruction. The point of obstruction is probably in the mid to distal small  bowel although a discrete transition zone cannot be identified on the current exam. There is no associated pneumatosis or bowel wall thickening. No interloop mesenteric fluid. 2. Circumferential wall thickening in the distal esophagus associated with small hiatal hernia. Changes in the esophagus may be related and reflux. 3. 3.4 cm abdominal aortic aneurysm. Recommend followup by ultrasound in 3 years. This recommendation follows ACR consensus guidelines: White Paper of the ACR Incidental Findings Committee II on Vascular Findings. [facility] Coll Radiol 2013; 10:789-794 Electronically Signed   By: Kennith Center M.D.   On: 10/23/2015 18:57   Dg Abd Acute W/chest  10/23/2015  CLINICAL DATA:  82 year old male with history of abdominal pain. EXAM: DG ABDOMEN ACUTE W/ 1V CHEST COMPARISON:  No  priors. FINDINGS: Several small dense nodules in the right hemithorax are most compatible with calcified granulomas. Bilateral nipple shadows incidentally noted. No acute consolidative airspace disease. No pleural effusions. No evidence of pulmonary edema. No pneumothorax. Heart size is normal. Upper mediastinal contours are within normal limits. Atherosclerosis in the thoracic aorta. Numerous dilated loops of small bowel measuring up to 6.3 cm in diameter. There is a small amount of colonic and rectal gas and stool noted. No frank pneumoperitoneum confidently identified. Orthopedic fixation hardware in the left hip incompletely visualize. Numerous vascular calcifications are noted. IMPRESSION: 1. Appearance of the abdomen is compatible with small bowel obstruction, as above. Given the presence of gas and stool in the colon, obstruction may be early, or could be partial. 2. No pneumoperitoneum. 3. No radiographic evidence of acute cardiopulmonary disease. 4. Atherosclerosis. Electronically Signed   By: Trudie Reed M.D.   On: 10/23/2015 14:39   I have personally reviewed and evaluated these images and lab results as part of my medical decision-making.   EKG Interpretation   Date/Time:  Saturday October 23 2015 15:36:07 EST Ventricular Rate:  115 PR Interval:    QRS Duration: 91 QT Interval:  322 QTC Calculation: 445 R Axis:   75 Text Interpretation:  Atrial fibrillation Ventricular premature complex  Probable anterior and lateral infarct, age indeterminate - t wave  inversions seen lateral ST wave depression No old tracing to compare  Confirmed by Arther Heisler, MD, Janey Genta [phone]) on 10/23/2015 3:57:53 PM      MDM   Final diagnoses:  Atrial fibrillation, unspecified type (HCC)  Lactic acidosis  SBO (small bowel obstruction) (HCC)  AKI (acute kidney injury) (HCC)    Pt comes in with abd pain, nausea, emesis. He is noted to be in new onset afib - he has no chest pain, dib,  palpitations.  Abd: DDX: SBO, mesenteric ischemia, Appendicitis w/ or w/o rupture, volvulus/intusseception with cancer as the leading point.  We will get CT abd ordered.  Pt also has 2/3 SIRS at arrival -sepsis is certainly possible, and it's possible that afib is being driven due to sepsis - we will start hydration. We will get cultures.  6:30 Elevated lactate and WC - code sepsis called, zosyn started.  8:15 SBO noticed. Surgery on consult. NG ordered. AKI is new, Afib is new.  CHADVASC score is 2. Heparin is initiated.     Derwood Kaplan, MD 10/23/15 2018

## 2015-10-23 NOTE — H&P (Signed)
[facility] History and Physical  Jesse HamburgerJagos Bottomley UJW:[id number] DOB: [birth year] DOA: 10/23/2015  Referring physician: Derwood KaplanAnkit Nanavati, MD PCP: Jearld Leschwight M Williams, MD   Chief Complaint: Abdominal pain and vomiting.  HPI: Jesse Barber is a 82 y.o. male with a past medical history of tobacco use disorder, dementia, depression, osteoarthritis, COPD who comes to the emergency department via EMS due to abdominal pain since Wednesday evening and emesis since early morning Thursday.  Per patient's daughter-in-law who is translating, the patient complained of abdominal pain on Wednesday evening and did not have dinner. He had a glass of milk and then Thursday early morning had a first episodes of emesis. Since then, the patient's son states that he must have had at least 20 episodes of emesis. He states that even water triggers nausea and emesis. They also had noticed that the patient's abdomen has become distended. It is not specifically known when was his last bowel movement, but he has been several days.  When seen the patient was in no acute distress. He stated through translation that he was feeling better with the analgesics and antiemetic provider earlier. His family members as stated that he looks and feels better after treatment. Workup in the emergency department shows elevated WBC, BUN, creatinine, telemetry and EKG showing atrial fibrillation and imaging consistent with SBO. Incidentally, imaging demonstrated at 3.4 cm AAA without rupture.    Review of Systems:  Constitutional:  Positive fatigue.  No weight loss, night sweats, Fevers, chills HEENT:  No headaches, Difficulty swallowing,Tooth/dental problems,Sore throat,  No sneezing, itching, ear ache, nasal congestion, post nasal drip,  Cardio-vascular:  No chest pain, Orthopnea, PND, swelling in lower extremities, anasarca, dizziness, palpitations  GI:  Positive indigestion, abdominal pain, nausea, vomiting, diarrhea, change in  bowel habits, loss of appetite  Resp:  Occasional productive cough. Denies dyspnea, wheezing or hemoptysis. Skin:  no rash or lesions.  GU:  no dysuria, change in color of urine, no urgency or frequency. No flank pain.  Musculoskeletal:  No joint pain or swelling. No decreased range of motion. No back pain.  Psych:  No change in mood or affect. No depression or anxiety. No memory loss.   Past Medical History  Diagnosis Date  . Asthma   . Arthritis   . Depression   . Dementia   . Incontinence    History reviewed. No pertinent past surgical history. Social History:  reports that he has been smoking Cigarettes.  He has a 60 pack-year smoking history. He has never used smokeless tobacco. He reports that he does not drink alcohol or use illicit drugs.  No Known Allergies  History reviewed. No pertinent family history.   Prior to Admission medications   Medication Sig Start Date End Date Taking? Authorizing Provider  amitriptyline (ELAVIL) 25 MG tablet Take 25 mg by mouth at bedtime.   Yes Historical Provider, MD  CVS VIT D 5000 HIGH-POTENCY 5000 units capsule Take 5,000 Units by mouth daily. 10/09/15  Yes Historical Provider, MD  donepezil (ARICEPT) 5 MG tablet Take 5 mg by mouth at bedtime.   Yes Historical Provider, MD  solifenacin (VESICARE) 10 MG tablet Take by mouth daily.   Yes Historical Provider, MD   Physical Exam: Filed Vitals:   10/23/15 2000 10/23/15 2030 10/23/15 2100 10/23/15 2130  BP: 104/84 100/69 101/81 104/73  Pulse:   77   Temp:      TempSrc:      Resp: 16 15 17 15   Weight:  SpO2:   96%     Wt Readings from Last 3 Encounters:  10/23/15 54.522 kg (120 lb 3.2 oz)  10/23/15 56.246 kg (124 lb)    General:  Appears calm and comfortable Eyes: PERRL, normal lids, irises & conjunctiva ENT: grossly normal hearing, lips & tongue. Oral mucosa was dry. Neck: no LAD, masses or thyromegaly Cardiovascular: Irregularly irregular, tachycardic at 104 bpm, No LE  edema. Telemetry: Atrial fibrillation with RVR at 103 bpm Respiratory: CTA bilaterally, no w/r/r. Normal respiratory effort. Abdomen: Distended, decreased bowel sounds, soft, ntnd, no guarding, no rebound. Skin: no rash or induration seen on limited exam Musculoskeletal: grossly normal tone BUE/BLE Psychiatric: grossly normal mood and affect, speech fluent and appropriate Neurologic: Awake, alert, apparently oriented 3 through translation, grossly non-focal.          Labs on Admission:  Basic Metabolic Panel:  Recent Labs Lab 10/23/15 1628 10/23/15 2015 10/23/15 2024  NA 133*  --  133*  K 3.8  --  3.4*  CL 85*  --  95*  CO2 25  --   --   GLUCOSE 149*  --  119*  BUN 86*  --  69*  CREATININE 3.39*  --  2.60*  CALCIUM 9.7  --   --   MG  --  1.9  --   PHOS  --  4.3  --    Liver Function Tests:  Recent Labs Lab 10/23/15 1628  AST 25  ALT 12*  ALKPHOS 75  BILITOT 2.2*  PROT 7.2  ALBUMIN 4.1    Recent Labs Lab 10/23/15 1628  LIPASE 25   CBC:  Recent Labs Lab 10/23/15 1421 10/23/15 1628 10/23/15 2024  WBC 20.2* 19.6*  --   HGB 17.7 17.7* 17.3*  HCT 50.4 51.6 51.0  MCV 91.6 91.3  --   PLT  --  228  --    Cardiac Enzymes:  Recent Labs Lab 10/23/15 2015  TROPONINI 0.06*     Radiological Exams on Admission: Ct Abdomen Pelvis Wo Contrast  10/23/2015  CLINICAL DATA:  Nausea and vomiting with generalized abdominal pain. EXAM: CT ABDOMEN AND PELVIS WITHOUT CONTRAST TECHNIQUE: Multidetector CT imaging of the abdomen and pelvis was performed following the standard protocol without IV contrast. COMPARISON:  Acute abdomen series from earlier today. FINDINGS: Lower chest: Emphysema noted in the lung bases with bronchial wall thickening and dependent atelectasis. Hepatobiliary: No focal abnormality in the liver on this study without intravenous contrast. No evidence of hepatomegaly. Gallbladder is distended. No intrahepatic or extrahepatic biliary dilation.  Pancreas: No focal mass lesion. No dilatation of the main duct. No intraparenchymal cyst. No peripancreatic edema. Spleen: No splenomegaly. No focal mass lesion. Adrenals/Urinary Tract: No adrenal nodule or mass. No renal stones or hydronephrosis. No definite renal mass lesion on this study without intravenous contrast material. No evidence for hydroureter. The urinary bladder appears normal for the degree of distention. Stomach/Bowel: Mild circumferential wall thickening is seen in the distal esophagus and there is a small hiatal hernia. Stomach otherwise unremarkable. Duodenum is normally positioned as is the ligament of Treitz. Proximal jejunal loops are opacified and not substantially dilated. However, mid small bowel is air-filled and dilated up to 4.2 cm in diameter. The distal ileal loops are completely decompressed and the terminal ileum is visible on image 41 series 21. No definite transition zone can be identified. The colon is largely decompressed throughout. No evidence for bowel wall thickening. No pneumatosis. Vascular/Lymphatic: Abdominal aortic atherosclerosis noted with infrarenal  abdominal aorta measuring up to 3.4 cm in diameter. There is no gastrohepatic or hepatoduodenal ligament lymphadenopathy. No intraperitoneal or retroperitoneal lymphadenopathy. No pelvic sidewall lymphadenopathy. Reproductive: The prostate gland and seminal vesicles have normal imaging features. Other: No intraperitoneal free fluid. No evidence for interloop mesenteric fluid Musculoskeletal: Patient is status post ORIF for proximal left femur fracture. Bones are diffusely demineralized Bone windows reveal no worrisome lytic or sclerotic osseous lesions. IMPRESSION: 1. Dilated small bowel up to 4.2 cm compatible with small bowel obstruction. The point of obstruction is probably in the mid to distal small bowel although a discrete transition zone cannot be identified on the current exam. There is no associated pneumatosis or  bowel wall thickening. No interloop mesenteric fluid. 2. Circumferential wall thickening in the distal esophagus associated with small hiatal hernia. Changes in the esophagus may be related and reflux. 3. 3.4 cm abdominal aortic aneurysm. Recommend followup by ultrasound in 3 years. This recommendation follows ACR consensus guidelines: White Paper of the ACR Incidental Findings Committee II on Vascular Findings. [facility] [employer] Coll Radiol 2013; 10:789-794 Electronically Signed   By: Kennith Center M.D.   On: 10/23/2015 18:57   Dg Abd Acute W/chest  10/23/2015  CLINICAL DATA:  82 year old male with history of abdominal pain. EXAM: DG ABDOMEN ACUTE W/ 1V CHEST COMPARISON:  No priors. FINDINGS: Several small dense nodules in the right hemithorax are most compatible with calcified granulomas. Bilateral nipple shadows incidentally noted. No acute consolidative airspace disease. No pleural effusions. No evidence of pulmonary edema. No pneumothorax. Heart size is normal. Upper mediastinal contours are within normal limits. Atherosclerosis in the thoracic aorta. Numerous dilated loops of small bowel measuring up to 6.3 cm in diameter. There is a small amount of colonic and rectal gas and stool noted. No frank pneumoperitoneum confidently identified. Orthopedic fixation hardware in the left hip incompletely visualize. Numerous vascular calcifications are noted. IMPRESSION: 1. Appearance of the abdomen is compatible with small bowel obstruction, as above. Given the presence of gas and stool in the colon, obstruction may be early, or could be partial. 2. No pneumoperitoneum. 3. No radiographic evidence of acute cardiopulmonary disease. 4. Atherosclerosis. Electronically Signed   By: Trudie Reed M.D.   On: 10/23/2015 14:39    EKG: Independently reviewed.  Vent. rate 115 BPM PR interval * ms QRS duration 91 ms QT/QTc 322/445 ms P-R-T axes -1 75 247 Atrial fibrillation Ventricular premature complex Probable anterior and  lateral infarct, age indeterminate - t wave inversions seen lateral ST wave depression No old tracing to compare  Assessment/Plan Principal Problem:   SBO (small bowel obstruction) (HCC)   Sepsis Admit to a stepdown/inpatient Keep nothing by mouth. Continue NG tube suctioning. Continue IV hydration. Continue IV antibiotics. Follow-up blood cultures and sensitivity. Monitor CBC, renal function and electrolytes closely. Surgical consult appreciated.  Active Problems:   AKI (acute kidney injury) (HCC) Likely due to dehydration. Continue IV hydration.    Nausea and vomiting in adult Continue IV hydration. Continue Zofran as needed. Pantoprazole 40 mg IVPB every 24 hours    AAA (abdominal aortic aneurysm 3.4 cm) without rupture (HCC) I informed the patient's and his relatives about this finding. I told him about the need of regular imaging follow-ups and this should be set up with the patient's PCP.    Dementia Continue supportive care. Resume Aricept once the patient is tolerating by mouth.     COPD (chronic obstructive pulmonary disease) (HCC) Continue supplemental oxygen. Bronchodilators as needed.  Tobacco abuse disorder Declined nicotine replacement therapy through interpreter since he has not smoked since Wednesday.     Language barrier to communication The patient speaks [place], but his son and daughter-in-law are able to translate.   Consults:  Dr. Jimmye Norman from the general surgery service consulted.  Code Status: Full code. DVT Prophylaxis: Lovenox SQ. Family Communication: Her son and daughter-in-law were present in the room and helped with translation. Disposition Plan: Admit for IV antibiotic therapy, NGT suctioning, IV hydration and surgical follow-up.  Time spent: Over 70 minutes were spent in the process admission.  Bobette Mo, M.D. [facility] Pager [phone].

## 2015-10-23 NOTE — ED Notes (Signed)
Called receiving floor in regards to NGT still needs placement.

## 2015-10-23 NOTE — ED Notes (Signed)
Pt from UC via [facility] with c/o RLQ pain with emesis x 3 days.  Emesis is yellow/green x 3 days.  EMS reports rebound tenderness.  Family reported to UC that pt has had increased AMS compared to baseline dementia x 3 days.  EMS reports expiratory wheezing with hx of smoking.  Family denies hx of wheezing.  Denies CP, or cough.  A-fib present on EKG with depression.  Given 5 mg albuterol.  Speaks only [place].  NAD, A&O.

## 2015-10-23 NOTE — Progress Notes (Signed)
Subjective:   Patient ID: Jesse Barber, male     DOB: Jul 19, 1929, 82 y.o.    MRN: [id number]  PCP: No PCP Per Patient  Chief Complaint  Patient presents with  . Emesis  . Cough  . Shortness of Breath    HPI  Presents for evaluation of 3-4 days of abdominal pain, nausea, vomiting and urinary incontinence. Son and daughter-in-law are present and provide the history and translation for the patient who speaks only [place].  History significant for dementia, incontinence and depression or insomnia. 60 pack-year smoking history.  Increased thirst. No pain with urination. Loss of appetite. Unable to keep down liquids. No hematemesis. No diarrhea, constipation, last BM reportedly yesterday. No chest pain. No SOB.   Prior to Admission medications   Medication Sig Start Date End Date Taking? Authorizing Provider  amitriptyline (ELAVIL) 25 MG tablet Take 25 mg by mouth at bedtime.   Yes Historical Provider, MD  donepezil (ARICEPT) 5 MG tablet Take 5 mg by mouth at bedtime.   Yes Historical Provider, MD  solifenacin (VESICARE) 10 MG tablet Take by mouth daily.   Yes Historical Provider, MD  [employer] VIT D 5000 HIGH-POTENCY 5000 units capsule Take 5,000 Units by mouth daily. 10/09/15   Historical Provider, MD     Not on File   There are no active problems to display for this patient.    History reviewed. No pertinent family history.   Social History   Social History  . Marital Status: Single    Spouse Name: N/A  . Number of Children: N/A  . Years of Education: N/A   Occupational History  . Not on file.   Social History Main Topics  . Smoking status: Current Every Day Smoker  . Smokeless tobacco: Never Used  . Alcohol Use: No     Comment: Quit drinking 2 years ago  . Drug Use: No  . Sexual Activity: No   Other Topics Concern  . Not on file   Social History Narrative  . No narrative on file        Review of Systems Constitutional: Positive for fever.    Respiratory: Positive for shortness of breath. Negative for cough and chest tightness.  Cardiovascular: Negative for chest pain.  Gastrointestinal: Positive for nausea, vomiting, abdominal pain and abdominal distention. Negative for diarrhea and constipation.  Endocrine: Positive for polydipsia and polyuria.  Genitourinary: Negative for dysuria and hematuria.  Neurological: Positive for dizziness and light-headedness. Negative for headaches.      Objective:  Physical Exam  Constitutional: He is oriented to person, place, and time. He appears well-developed. He appears cachectic. He is active and cooperative. He has a sickly appearance. No distress.  BP 122/89 mmHg  Pulse 132  Temp(Src) 97.8 F (36.6 C) (Axillary)  Ht  (1.651 m)  Wt 124 lb (56.246 kg)  BMI 20.63 kg/m2  HENT:  Head: Normocephalic and atraumatic.  Right Ear: Hearing normal.  Left Ear: Hearing normal.  Eyes: Conjunctivae are normal. No scleral icterus.  Neck: Normal range of motion, full passive range of motion without pain and phonation normal. Neck supple. No thyromegaly present.  Cardiovascular: Normal heart sounds and intact distal pulses.  An irregularly irregular rhythm present. Tachycardia present.   Pulses:      Radial pulses are 2+ on the right side, and 2+ on the left side.  Pulmonary/Chest: Effort normal and breath sounds normal.  Abdominal: There is generalized tenderness (worst in the RLQ). There is no  rigidity, no rebound and no guarding.  Lymphadenopathy:       Head (right side): No tonsillar, no preauricular, no posterior auricular and no occipital adenopathy present.       Head (left side): No tonsillar, no preauricular, no posterior auricular and no occipital adenopathy present.    He has no cervical adenopathy.       Right: No supraclavicular adenopathy present.       Left: No supraclavicular adenopathy present.  Neurological: He is alert and oriented to person, place, and time. Disoriented:  behavior is appropriate. No sensory deficit.  Skin: Skin is warm, dry and intact. No rash noted. No cyanosis or erythema. Nails show no clubbing.  Mottling on the fingers  Psychiatric: He has a normal mood and affect. His speech is normal and behavior is normal. Cognition and memory are impaired. He exhibits abnormal recent memory.        Results for orders placed or performed in visit on 10/23/15  POCT CBC  Result Value Ref Range   WBC 20.2 (A) 4.6 - 10.2 K/uL   Lymph, poc 1.0 0.6 - 3.4   POC LYMPH PERCENT 4.9 (A) 10 - 50 %L   MID (cbc) 0.7 0 - 0.9   POC MID % 3.3 0 - 12 %M   POC Granulocyte 18.5 (A) 2 - 6.9   Granulocyte percent 91.8 (A) 37 - 80 %G   RBC 5.50 4.69 - 6.13 M/uL   Hemoglobin 17.7 14.1 - 18.1 g/dL   HCT, POC 16.1 09.6 - 53.7 %   MCV 91.6 80 - 97 fL   MCH, POC 32.2 (A) 27 - 31.2 pg   MCHC 35.2 31.8 - 35.4 g/dL   RDW, POC 04.5 %   Platelet Count, POC 237 142 - 424 K/uL   MPV 8.3 0 - 99.8 fL  POCT glucose (manual entry)  Result Value Ref Range   POC Glucose 152 (A) 70 - 99 mg/dl     EKG reviewed wth Dr. Clelia Croft. AFib. ST depression, uncertain age. EMS performing posterior 12-lead en route.   Dg Abd Acute W/chest  10/23/2015  CLINICAL DATA:  82 year old male with history of abdominal pain. EXAM: DG ABDOMEN ACUTE W/ 1V CHEST COMPARISON:  No priors. FINDINGS: Several small dense nodules in the right hemithorax are most compatible with calcified granulomas. Bilateral nipple shadows incidentally noted. No acute consolidative airspace disease. No pleural effusions. No evidence of pulmonary edema. No pneumothorax. Heart size is normal. Upper mediastinal contours are within normal limits. Atherosclerosis in the thoracic aorta. Numerous dilated loops of small bowel measuring up to 6.3 cm in diameter. There is a small amount of colonic and rectal gas and stool noted. No frank pneumoperitoneum confidently identified. Orthopedic fixation hardware in the left hip incompletely  visualize. Numerous vascular calcifications are noted. IMPRESSION: 1. Appearance of the abdomen is compatible with small bowel obstruction, as above. Given the presence of gas and stool in the colon, obstruction may be early, or could be partial. 2. No pneumoperitoneum. 3. No radiographic evidence of acute cardiopulmonary disease. 4. Atherosclerosis. Electronically Signed   By: Trudie Reed M.D.   On: 10/23/2015 14:39        Assessment & Plan:  1. Generalized abdominal pain 2. Nausea and vomiting, intractability of vomiting not specified, unspecified vomiting type - POCT CBC - DG Abd Acute W/Chest; Future  3. SBO (small bowel obstruction) (HCC)  4. Leucocytosis  5. Polydipsia - POCT glucose (manual entry)  6. Hyperglycemia  7. Altered mental status, unspecified altered mental status type - EKG 12-Lead - DG Abd Acute W/Chest; Future  8. Atrial fibrillation, unspecified type (HCC)   Complex patient with dementia, baseline urinary symptoms and communication barrier, with 3 days of nausea, vomiting, abdominal pain and urinary incontinence. Found today to be in AFib. T-wave depression of unknown age. Significant leucocytosis with left shift. SBO seen on acute abdominal series. Glucose of 152 may represent new diagnosis of diabetes.To ED by EMS. Charge nurse notified.  Fernande Brashelle S. [place] Natarajan, PA-C Physician Assistant-Certified [facility]

## 2015-10-23 NOTE — ED Notes (Signed)
Received call from lab, lactic acid of 2.1.

## 2015-10-23 NOTE — Progress Notes (Signed)
Pharmacy Antibiotic Note  Jesse Barber is a 82 y.o. male admitted on 10/23/2015 with sepsis.  Pharmacy has been consulted for zosyn dosing.  Plan: -Zosyn 2.25 g IV q8h -Monitor renal fx, cultures   Weight: 120 lb 3.2 oz (54.522 kg)  Temp (24hrs), Avg:97.8 F (36.6 C), Min:97.7 F (36.5 C), Max:98 F (36.7 C)   Recent Labs Lab 10/23/15 1421 10/23/15 1628 10/23/15 1642  WBC 20.2* 19.6*  --   LATICACIDVEN  --   --  3.76*    CrCl cannot be calculated (Patient has no serum creatinine result on file.).    No Known Allergies  Antimicrobials this admission: 3/11 blood cx: 3/11 urine cx:  Dose adjustments this admission: NA  Microbiology results:   Thank you for allowing pharmacy to be a part of this patient's care.  Baldemar FridayMasters, Addisynn Vassell M 10/23/2015 5:13 PM   Pharmacy Code Sepsis Protocol  Time of code sepsis page: 1709  []  Antibiotics delivered at  [x]  Antibiotics administered prior to code at 1704 (if checked, omit next 2 questions)  Were antibiotics ordered at the time of the code sepsis page? Yes Was it required to contact the physician? []  Physician not contacted []  Physician contacted to order antibiotics for code sepsis []  Physician contacted to recommend changing antibiotics  Pharmacy consulted for: Zosyn  Anti-infectives    Start     Dose/Rate Route Frequency Ordered Stop   10/23/15 1700  piperacillin-tazobactam (ZOSYN) IVPB 3.375 g     3.375 g 100 mL/hr over 30 Minutes Intravenous  Once 10/23/15 1654          Nurse education provided: []  Minutes left to administer antibiotics to achieve 1 hour goal []  Correct order of antibiotic administration []  Antibiotic Y-site compatibilities     Racquel Arkin, Darl HouseholderAlison M, PharmD 10/23/2015, 5:14 PM

## 2015-10-23 NOTE — ED Notes (Signed)
Relatives leaving phone number [phone] Kathlene NovemberMike, son, if needed.

## 2015-10-24 ENCOUNTER — Inpatient Hospital Stay (HOSPITAL_COMMUNITY): Payer: Medicare Other

## 2015-10-24 DIAGNOSIS — I4891 Unspecified atrial fibrillation: Secondary | ICD-10-CM

## 2015-10-24 DIAGNOSIS — R7989 Other specified abnormal findings of blood chemistry: Secondary | ICD-10-CM

## 2015-10-24 DIAGNOSIS — R9431 Abnormal electrocardiogram [ECG] [EKG]: Secondary | ICD-10-CM

## 2015-10-24 LAB — CBC WITH DIFFERENTIAL/PLATELET
BASOS ABS: 0 10*3/uL (ref 0.0–0.1)
Basophils Relative: 0 %
EOS PCT: 0 %
Eosinophils Absolute: 0 10*3/uL (ref 0.0–0.7)
HCT: 41.7 % (ref 39.0–52.0)
Hemoglobin: 14.3 g/dL (ref 13.0–17.0)
LYMPHS PCT: 8 %
Lymphs Abs: 1 10*3/uL (ref 0.7–4.0)
MCH: 31.1 pg (ref 26.0–34.0)
MCHC: 34.3 g/dL (ref 30.0–36.0)
MCV: 90.7 fL (ref 78.0–100.0)
MONO ABS: 0.9 10*3/uL (ref 0.1–1.0)
MONOS PCT: 7 %
Neutro Abs: 10.7 10*3/uL — ABNORMAL HIGH (ref 1.7–7.7)
Neutrophils Relative %: 85 %
PLATELETS: 188 10*3/uL (ref 150–400)
RBC: 4.6 MIL/uL (ref 4.22–5.81)
RDW: 14.6 % (ref 11.5–15.5)
WBC: 12.6 10*3/uL — ABNORMAL HIGH (ref 4.0–10.5)

## 2015-10-24 LAB — COMPREHENSIVE METABOLIC PANEL
ALBUMIN: 3.1 g/dL — AB (ref 3.5–5.0)
ALK PHOS: 54 U/L (ref 38–126)
ALT: 11 U/L — ABNORMAL LOW (ref 17–63)
AST: 17 U/L (ref 15–41)
Anion gap: 15 (ref 5–15)
BILIRUBIN TOTAL: 1.6 mg/dL — AB (ref 0.3–1.2)
BUN: 74 mg/dL — AB (ref 6–20)
CALCIUM: 8.1 mg/dL — AB (ref 8.9–10.3)
CO2: 25 mmol/L (ref 22–32)
Chloride: 93 mmol/L — ABNORMAL LOW (ref 101–111)
Creatinine, Ser: 2.7 mg/dL — ABNORMAL HIGH (ref 0.61–1.24)
GFR calc Af Amer: 23 mL/min — ABNORMAL LOW (ref >60)
GFR calc non Af Amer: 20 mL/min — ABNORMAL LOW (ref >60)
GLUCOSE: 156 mg/dL — AB (ref 65–99)
Potassium: 3.2 mmol/L — ABNORMAL LOW (ref 3.5–5.1)
Sodium: 133 mmol/L — ABNORMAL LOW (ref 135–145)
TOTAL PROTEIN: 5.6 g/dL — AB (ref 6.5–8.1)

## 2015-10-24 LAB — HEPARIN LEVEL (UNFRACTIONATED)
HEPARIN UNFRACTIONATED: 0.95 [IU]/mL — AB (ref 0.30–0.70)
Heparin Unfractionated: 0.14 IU/mL — ABNORMAL LOW (ref 0.30–0.70)
Heparin Unfractionated: 0.28 IU/mL — ABNORMAL LOW (ref 0.30–0.70)

## 2015-10-24 LAB — POTASSIUM: Potassium: 3.7 mmol/L (ref 3.5–5.1)

## 2015-10-24 LAB — TSH: TSH: 2.622 u[IU]/mL (ref 0.350–4.500)

## 2015-10-24 LAB — TROPONIN I: Troponin I: 0.06 ng/mL — ABNORMAL HIGH (ref <0.031)

## 2015-10-24 LAB — MRSA PCR SCREENING: MRSA by PCR: NEGATIVE

## 2015-10-24 MED ORDER — ASPIRIN 300 MG RE SUPP
150.0000 mg | Freq: Every day | RECTAL | Status: DC
  Administered 2015-10-24 – 2015-10-25 (×2): 150 mg via RECTAL
  Filled 2015-10-24 (×3): qty 1

## 2015-10-24 MED ORDER — HEPARIN BOLUS VIA INFUSION
1000.0000 [IU] | Freq: Once | INTRAVENOUS | Status: AC
  Administered 2015-10-24: 1000 [IU] via INTRAVENOUS
  Filled 2015-10-24: qty 1000

## 2015-10-24 MED ORDER — DIGOXIN 0.25 MG/ML IJ SOLN
0.1250 mg | Freq: Four times a day (QID) | INTRAMUSCULAR | Status: AC
  Administered 2015-10-24 (×2): 0.125 mg via INTRAVENOUS
  Filled 2015-10-24 (×2): qty 2

## 2015-10-24 MED ORDER — DEXTROSE-NACL 5-0.45 % IV SOLN
INTRAVENOUS | Status: DC
Start: 2015-10-24 — End: 2015-10-24
  Administered 2015-10-24: 10:00:00 via INTRAVENOUS

## 2015-10-24 MED ORDER — DIGOXIN 125 MCG PO TABS: 0.1250 mg | ORAL_TABLET | Freq: Every day | ORAL | Status: DC

## 2015-10-24 MED ORDER — METOPROLOL TARTRATE 50 MG PO TABS: 50.0000 mg | ORAL_TABLET | Freq: Two times a day (BID) | ORAL | Status: DC

## 2015-10-24 MED ORDER — MORPHINE SULFATE (PF) 2 MG/ML IV SOLN
1.0000 mg | INTRAVENOUS | Status: DC | PRN
Start: 2015-10-24 — End: 2015-10-28
  Administered 2015-10-27: 1 mg via INTRAVENOUS
  Filled 2015-10-24: qty 1

## 2015-10-24 MED ORDER — METOPROLOL TARTRATE 1 MG/ML IV SOLN
5.0000 mg | Freq: Three times a day (TID) | INTRAVENOUS | Status: DC
Start: 2015-10-24 — End: 2015-10-24
  Filled 2015-10-24: qty 5

## 2015-10-24 MED ORDER — METOPROLOL TARTRATE 25 MG PO TABS: 25.0000 mg | ORAL_TABLET | Freq: Two times a day (BID) | ORAL | Status: DC

## 2015-10-24 MED ORDER — DIGOXIN 125 MCG PO TABS
0.0625 mg | ORAL_TABLET | Freq: Every day | ORAL | Status: DC
  Administered 2015-10-25 – 2015-10-27 (×2): 0.0625 mg via ORAL
  Filled 2015-10-24 (×2): qty 1

## 2015-10-24 MED ORDER — LIDOCAINE VISCOUS 2 % MT SOLN
OROMUCOSAL | Status: AC
  Administered 2015-10-24: 15:00:00
  Filled 2015-10-24: qty 15

## 2015-10-24 MED ORDER — POTASSIUM CHLORIDE 10 MEQ/100ML IV SOLN
10.0000 meq | INTRAVENOUS | Status: AC
  Administered 2015-10-24 (×5): 10 meq via INTRAVENOUS
  Filled 2015-10-24 (×5): qty 100

## 2015-10-24 MED ORDER — METOPROLOL TARTRATE 1 MG/ML IV SOLN: 5.0000 mg | INTRAVENOUS | Status: DC | PRN

## 2015-10-24 MED ORDER — POTASSIUM CHLORIDE IN NACL 40-0.9 MEQ/L-% IV SOLN
INTRAVENOUS | Status: DC
  Administered 2015-10-24: 75 mL/h via INTRAVENOUS
  Filled 2015-10-24 (×3): qty 1000

## 2015-10-24 NOTE — Progress Notes (Signed)
Subjective: Pt awake alert  Objective: Vital signs in last 24 hours: Temp:  [97.7 F (36.5 C)-98.5 F (36.9 C)] 98.5 F (36.9 C) (03/12 0400) Pulse Rate:  [77-148] 86 (03/12 0800) Resp:  [14-24] 15 (03/12 0800) BP: (100-150)/(58-92) 105/75 mmHg (03/12 0800) SpO2:  [90 %-100 %] 98 % (03/12 0400) Weight:  [54.522 kg (120 lb 3.2 oz)-56.5 kg (124 lb 9 oz)] 56.5 kg (124 lb 9 oz) (03/11 2315) Last BM Date: 10/22/15  Intake/Output from previous day: 03/11 0701 - 03/12 0700 In: 3194 [I.V.:2944; IV Piggyback:250] Out: 150 [Urine:150] Intake/Output this shift:    GI: mild distention  sore but no peritonitis   quiet   Lab Results:   Recent Labs  10/23/15 1628 10/23/15 2024 10/24/15 0316  WBC 19.6*  --  12.6*  HGB 17.7* 17.3* 14.3  HCT 51.6 51.0 41.7  PLT 228  --  188   BMET  Recent Labs  10/23/15 1628 10/23/15 2024 10/24/15 0316  NA 133* 133* 133*  K 3.8 3.4* 3.2*  CL 85* 95* 93*  CO2 25  --  25  GLUCOSE 149* 119* 156*  BUN 86* 69* 74*  CREATININE 3.39* 2.60* 2.70*  CALCIUM 9.7  --  8.1*   PT/INR No results for input(s): LABPROT, INR in the last 72 hours. ABG No results for input(s): PHART, HCO3 in the last 72 hours.  Invalid input(s): PCO2, PO2  Studies/Results: Ct Abdomen Pelvis Wo Contrast  10/23/2015  CLINICAL DATA:  Nausea and vomiting with generalized abdominal pain. EXAM: CT ABDOMEN AND PELVIS WITHOUT CONTRAST TECHNIQUE: Multidetector CT imaging of the abdomen and pelvis was performed following the standard protocol without IV contrast. COMPARISON:  Acute abdomen series from earlier today. FINDINGS: Lower chest: Emphysema noted in the lung bases with bronchial wall thickening and dependent atelectasis. Hepatobiliary: No focal abnormality in the liver on this study without intravenous contrast. No evidence of hepatomegaly. Gallbladder is distended. No intrahepatic or extrahepatic biliary dilation. Pancreas: No focal mass lesion. No dilatation of the main  duct. No intraparenchymal cyst. No peripancreatic edema. Spleen: No splenomegaly. No focal mass lesion. Adrenals/Urinary Tract: No adrenal nodule or mass. No renal stones or hydronephrosis. No definite renal mass lesion on this study without intravenous contrast material. No evidence for hydroureter. The urinary bladder appears normal for the degree of distention. Stomach/Bowel: Mild circumferential wall thickening is seen in the distal esophagus and there is a small hiatal hernia. Stomach otherwise unremarkable. Duodenum is normally positioned as is the ligament of Treitz. Proximal jejunal loops are opacified and not substantially dilated. However, mid small bowel is air-filled and dilated up to 4.2 cm in diameter. The distal ileal loops are completely decompressed and the terminal ileum is visible on image 41 series 21. No definite transition zone can be identified. The colon is largely decompressed throughout. No evidence for bowel wall thickening. No pneumatosis. Vascular/Lymphatic: Abdominal aortic atherosclerosis noted with infrarenal abdominal aorta measuring up to 3.4 cm in diameter. There is no gastrohepatic or hepatoduodenal ligament lymphadenopathy. No intraperitoneal or retroperitoneal lymphadenopathy. No pelvic sidewall lymphadenopathy. Reproductive: The prostate gland and seminal vesicles have normal imaging features. Other: No intraperitoneal free fluid. No evidence for interloop mesenteric fluid Musculoskeletal: Patient is status post ORIF for proximal left femur fracture. Bones are diffusely demineralized Bone windows reveal no worrisome lytic or sclerotic osseous lesions. IMPRESSION: 1. Dilated small bowel up to 4.2 cm compatible with small bowel obstruction. The point of obstruction is probably in the mid to distal small bowel  although a discrete transition zone cannot be identified on the current exam. There is no associated pneumatosis or bowel wall thickening. No interloop mesenteric fluid. 2.  Circumferential wall thickening in the distal esophagus associated with small hiatal hernia. Changes in the esophagus may be related and reflux. 3. 3.4 cm abdominal aortic aneurysm. Recommend followup by ultrasound in 3 years. This recommendation follows ACR consensus guidelines: White Paper of the ACR Incidental Findings Committee II on Vascular Findings. Alba DestineJ Am Coll Radiol 2013; 10:789-794 Electronically Signed   By: Kennith CenterEric  Mansell M.D.   On: 10/23/2015 18:57   Dg Chest Port 1 View  10/23/2015  CLINICAL DATA:  Sepsis. EXAM: PORTABLE CHEST 1 VIEW COMPARISON:  Chest and abdominal radiographs earlier this day. Included lung bases from CT abdomen. FINDINGS: Lower lung volumes from prior exam. Developing ill-defined and linear bibasilar opacities. No pulmonary edema. No confluent airspace disease. No pleural effusion or pneumothorax. Dilated bowel loops in the right upper quadrant. IMPRESSION: Developing ill-defined linear bibasilar opacities, likely atelectasis. Given bowel obstruction, aspiration could have a similar appearance. Electronically Signed   By: Rubye OaksMelanie  Ehinger M.D.   On: 10/23/2015 23:59   Dg Abd Acute W/chest  10/23/2015  CLINICAL DATA:  82 year old male with history of abdominal pain. EXAM: DG ABDOMEN ACUTE W/ 1V CHEST COMPARISON:  No priors. FINDINGS: Several small dense nodules in the right hemithorax are most compatible with calcified granulomas. Bilateral nipple shadows incidentally noted. No acute consolidative airspace disease. No pleural effusions. No evidence of pulmonary edema. No pneumothorax. Heart size is normal. Upper mediastinal contours are within normal limits. Atherosclerosis in the thoracic aorta. Numerous dilated loops of small bowel measuring up to 6.3 cm in diameter. There is a small amount of colonic and rectal gas and stool noted. No frank pneumoperitoneum confidently identified. Orthopedic fixation hardware in the left hip incompletely visualize. Numerous vascular  calcifications are noted. IMPRESSION: 1. Appearance of the abdomen is compatible with small bowel obstruction, as above. Given the presence of gas and stool in the colon, obstruction may be early, or could be partial. 2. No pneumoperitoneum. 3. No radiographic evidence of acute cardiopulmonary disease. 4. Atherosclerosis. Electronically Signed   By: Trudie Reedaniel  Entrikin M.D.   On: 10/23/2015 14:39    Anti-infectives: Anti-infectives    Start     Dose/Rate Route Frequency Ordered Stop   10/24/15 0100  piperacillin-tazobactam (ZOSYN) IVPB 2.25 g     2.25 g 100 mL/hr over 30 Minutes Intravenous 3 times per day 10/23/15 1837     10/23/15 1700  piperacillin-tazobactam (ZOSYN) IVPB 3.375 g     3.375 g 100 mL/hr over 30 Minutes Intravenous  Once 10/23/15 1654 10/23/15 1810      Assessment/Plan: Patient Active Problem List   Diagnosis Date Noted  . Dementia 10/23/2015  . Urinary frequency 10/23/2015  . Insomnia 10/23/2015  . Smoker 10/23/2015  . Language barrier to communication 10/23/2015  . SBO (small bowel obstruction) (HCC) 10/23/2015  . COPD (chronic obstructive pulmonary disease) (HCC) 10/23/2015  . AKI (acute kidney injury) (HCC) 10/23/2015  . Nausea and vomiting in adult 10/23/2015  . Tobacco abuse disorder 10/23/2015  . AAA (abdominal aortic aneurysm) without rupture (HCC) 10/23/2015    Pt has no NGT?  Abdomen mild distention  Sore but no peritonitis  Films reviewed at bedside as being done while in the room  No contrast in colon but more air in colon   Needs NGT if able   Keep NPO   Repeat films in  am   Correct K      LOS: 1 day    Verlon Pischke A. 10/24/2015

## 2015-10-24 NOTE — Progress Notes (Signed)
ANTICOAGULATION CONSULT NOTE - Follow Up Consult  Pharmacy Consult for heparin Indication: atrial fibrillation  No Known Allergies  Patient Measurements: Height: 5\' 6"  (167.6 cm) Weight: 124 lb 9 oz (56.5 kg) IBW/kg (Calculated) : 63.8 Heparin Dosing Weight:   Vital Signs: Temp: 98.7 F (37.1 C) (03/12 2000) Temp Source: Oral (03/12 2000) BP: 120/75 mmHg (03/12 2000) Pulse Rate: 76 (03/12 2000)  Labs:  Recent Labs  10/23/15 1628 10/23/15 2015 10/23/15 2024 10/24/15 0316 10/24/15 1207 10/24/15 2028  HGB 17.7*  --  17.3* 14.3  --   --   HCT 51.6  --  51.0 41.7  --   --   PLT 228  --   --  188  --   --   HEPARINUNFRC  --   --   --  0.14* 0.28* 0.95*  CREATININE 3.39*  --  2.60* 2.70*  --   --   TROPONINI  --  0.06*  --  0.06*  --   --     Estimated Creatinine Clearance: 16.3 mL/min (by C-G formula based on Cr of 2.7).   Medications:  Scheduled:  . aspirin  150 mg Rectal Daily  . [START ON 10/25/2015] digoxin  0.0625 mg Oral Daily  . pantoprazole (PROTONIX) IV  40 mg Intravenous Q24H  . piperacillin-tazobactam (ZOSYN)  IV  2.25 g Intravenous 3 times per day    Assessment: 82yo admitted with SBO, on heparin for AFib.    HL now elevated at 0.95 after rate increase of 100 units/hr this afternoon? No issues noted by nursing. Adjust rate down and recheck in am.  Goal of Therapy:  Heparin level 0.3-0.7 units/ml Monitor platelets by anticoagulation protocol: Yes   Plan:  Decrease heparin to 800 units/hr Repeat HL in 8hr Watch for s/s of bleeding  Sheppard CoilFrank Danaria Larsen PharmD., BCPS Clinical Pharmacist Pager [phone] 10/24/2015 9:38 PM

## 2015-10-24 NOTE — Consult Note (Signed)
Admit date: 10/23/2015 Referring Physician  Dr. Thedore Mins Primary Physician  Dr. Lerry Liner Primary Cardiologist  None Reason for Consultation  New onset atrial fibrillation  HPI: This is an 82 yomale with a past medical history of tobacco use, dementia, depression, osteoarthritis and COPD who presented to the emergency department via EMS due to abdominal pain since Wednesday evening and emesis since early morning Thursday.  Patient does not speak English but apparently complained of abdominal pain on Wednesday evening and did not have dinner. He had a glass of milk and then Thursday early morning had a first episodes of emesis. Since then, he must have had at least 20 episodes of emesis. He states that even water triggers nausea and emesis.. Workup in the emergency department shows elevated WBC, BUN, creatinine, telemetry and EKG showing atrial fibrillation and imaging consistent with SBO. Incidentally, imaging demonstrated at 3.4 cm AAA without rupture.  He was admitted by the Hospitalist service and now Cardiology is asked to evaluate for new onset atrial fibrillation with CVR.  Hospitalist NP present and could communicate with patient.  Patient states that he will notice his heart speed up when he exerts himself.  He also admits to chest pain with exertion.     PMH:   Past Medical History  Diagnosis Date  . Asthma   . Arthritis   . Depression   . Dementia   . Incontinence      PSH:  History reviewed. No pertinent past surgical history.  Allergies:  Review of patient's allergies indicates no known allergies. Prior to Admit Meds:   Prescriptions prior to admission  Medication Sig Dispense Refill Last Dose  . amitriptyline (ELAVIL) 25 MG tablet Take 25 mg by mouth at bedtime.   Past Week at Unknown time  . CVS VIT D 5000 HIGH-POTENCY 5000 units capsule Take 5,000 Units by mouth daily.  2 10/22/2015 at Unknown time  . donepezil (ARICEPT) 5 MG tablet Take 5 mg by mouth at bedtime.    Past Week at Unknown time  . solifenacin (VESICARE) 10 MG tablet Take by mouth daily.   10/22/2015 at Unknown time   Fam HX:   History reviewed. No pertinent family history. Social HX:    Social History   Social History  . Marital Status: Single    Spouse Name: N/A  . Number of Children: N/A  . Years of Education: N/A   Occupational History  . retired    Social History Main Topics  . Smoking status: Current Every Day Smoker -- 1.00 packs/day for 60 years    Types: Cigarettes  . Smokeless tobacco: Never Used  . Alcohol Use: No     Comment: Quit drinking 2 years ago  . Drug Use: No  . Sexual Activity: No   Other Topics Concern  . Not on file   Social History Narrative   [place]   Came to the [place] in 2015 and lives with his son and daughter-in-law.     ROS:  All 11 ROS were addressed and are negative except what is stated in the HPI  Physical Exam: Blood pressure 120/75, pulse 76, temperature 98.7 F (37.1 C), temperature source Oral, resp. rate 18, height  (1.676 m), weight 124 lb 9 oz (56.5 kg), SpO2 98 %.    General: Well developed, well nourished, in no acute distress Head: Eyes PERRLA, No xanthomas.   Normal cephalic and atramatic  Lungs:   Clear bilaterally to auscultation and  percussion. Heart:   Irregularly irregular S1 S2 Pulses are 2+ & equal.            No carotid bruit. No JVD.  No abdominal bruits. No femoral bruits. Abdomen: distended with decreased BS Extremities:   No clubbing, cyanosis or edema.  DP +1 Neuro: Alert and oriented X 3. Psych:  Good affect, responds appropriately    Labs:   Lab Results  Component Value Date   WBC 12.6* 10/24/2015   HGB 14.3 10/24/2015   HCT 41.7 10/24/2015   MCV 90.7 10/24/2015   PLT 188 10/24/2015    Recent Labs Lab 10/24/15 0316 10/24/15 1207  NA 133*  --   K 3.2* 3.7  CL 93*  --   CO2 25  --   BUN 74*  --   CREATININE 2.70*  --   CALCIUM 8.1*  --   PROT 5.6*  --   BILITOT 1.6*  --    ALKPHOS 54  --   ALT 11*  --   AST 17  --   GLUCOSE 156*  --    No results found for: PTT No results found for: INR, PROTIME Lab Results  Component Value Date   TROPONINI 0.06* 10/24/2015    No results found for: CHOL No results found for: HDL No results found for: LDLCALC No results found for: TRIG No results found for: CHOLHDL No results found for: LDLDIRECT    Radiology:  Ct Abdomen Pelvis Wo Contrast  10/23/2015  CLINICAL DATA:  Nausea and vomiting with generalized abdominal pain. EXAM: CT ABDOMEN AND PELVIS WITHOUT CONTRAST TECHNIQUE: Multidetector CT imaging of the abdomen and pelvis was performed following the standard protocol without IV contrast. COMPARISON:  Acute abdomen series from earlier today. FINDINGS: Lower chest: Emphysema noted in the lung bases with bronchial wall thickening and dependent atelectasis. Hepatobiliary: No focal abnormality in the liver on this study without intravenous contrast. No evidence of hepatomegaly. Gallbladder is distended. No intrahepatic or extrahepatic biliary dilation. Pancreas: No focal mass lesion. No dilatation of the main duct. No intraparenchymal cyst. No peripancreatic edema. Spleen: No splenomegaly. No focal mass lesion. Adrenals/Urinary Tract: No adrenal nodule or mass. No renal stones or hydronephrosis. No definite renal mass lesion on this study without intravenous contrast material. No evidence for hydroureter. The urinary bladder appears normal for the degree of distention. Stomach/Bowel: Mild circumferential wall thickening is seen in the distal esophagus and there is a small hiatal hernia. Stomach otherwise unremarkable. Duodenum is normally positioned as is the ligament of Treitz. Proximal jejunal loops are opacified and not substantially dilated. However, mid small bowel is air-filled and dilated up to 4.2 cm in diameter. The distal ileal loops are completely decompressed and the terminal ileum is visible on image 41 series 21. No  definite transition zone can be identified. The colon is largely decompressed throughout. No evidence for bowel wall thickening. No pneumatosis. Vascular/Lymphatic: Abdominal aortic atherosclerosis noted with infrarenal abdominal aorta measuring up to 3.4 cm in diameter. There is no gastrohepatic or hepatoduodenal ligament lymphadenopathy. No intraperitoneal or retroperitoneal lymphadenopathy. No pelvic sidewall lymphadenopathy. Reproductive: The prostate gland and seminal vesicles have normal imaging features. Other: No intraperitoneal free fluid. No evidence for interloop mesenteric fluid Musculoskeletal: Patient is status post ORIF for proximal left femur fracture. Bones are diffusely demineralized Bone windows reveal no worrisome lytic or sclerotic osseous lesions. IMPRESSION: 1. Dilated small bowel up to 4.2 cm compatible with small bowel obstruction. The point of obstruction is probably in  the mid to distal small bowel although a discrete transition zone cannot be identified on the current exam. There is no associated pneumatosis or bowel wall thickening. No interloop mesenteric fluid. 2. Circumferential wall thickening in the distal esophagus associated with small hiatal hernia. Changes in the esophagus may be related and reflux. 3. 3.4 cm abdominal aortic aneurysm. Recommend followup by ultrasound in 3 years. This recommendation follows ACR consensus guidelines: White Paper of the ACR Incidental Findings Committee II on Vascular Findings. Alba DestineJ Am Coll Radiol 2013; 10:789-794 Electronically Signed   By: Kennith CenterEric  Mansell M.D.   On: 10/23/2015 18:57   Dg Chest Port 1 View  10/23/2015  CLINICAL DATA:  Sepsis. EXAM: PORTABLE CHEST 1 VIEW COMPARISON:  Chest and abdominal radiographs earlier this day. Included lung bases from CT abdomen. FINDINGS: Lower lung volumes from prior exam. Developing ill-defined and linear bibasilar opacities. No pulmonary edema. No confluent airspace disease. No pleural effusion or  pneumothorax. Dilated bowel loops in the right upper quadrant. IMPRESSION: Developing ill-defined linear bibasilar opacities, likely atelectasis. Given bowel obstruction, aspiration could have a similar appearance. Electronically Signed   By: Rubye OaksMelanie  Ehinger M.D.   On: 10/23/2015 23:59   Dg Abd Acute W/chest  10/23/2015  CLINICAL DATA:  82 year old male with history of abdominal pain. EXAM: DG ABDOMEN ACUTE W/ 1V CHEST COMPARISON:  No priors. FINDINGS: Several small dense nodules in the right hemithorax are most compatible with calcified granulomas. Bilateral nipple shadows incidentally noted. No acute consolidative airspace disease. No pleural effusions. No evidence of pulmonary edema. No pneumothorax. Heart size is normal. Upper mediastinal contours are within normal limits. Atherosclerosis in the thoracic aorta. Numerous dilated loops of small bowel measuring up to 6.3 cm in diameter. There is a small amount of colonic and rectal gas and stool noted. No frank pneumoperitoneum confidently identified. Orthopedic fixation hardware in the left hip incompletely visualize. Numerous vascular calcifications are noted. IMPRESSION: 1. Appearance of the abdomen is compatible with small bowel obstruction, as above. Given the presence of gas and stool in the colon, obstruction may be early, or could be partial. 2. No pneumoperitoneum. 3. No radiographic evidence of acute cardiopulmonary disease. 4. Atherosclerosis. Electronically Signed   By: Trudie Reedaniel  Entrikin M.D.   On: 10/23/2015 14:39   Dg Abd Portable 1v  10/24/2015  CLINICAL DATA:  Small bowel obstruction EXAM: PORTABLE ABDOMEN - 1 VIEW COMPARISON:  CT abdomen pelvis dated 10/23/2015 FINDINGS: Dilated loops of small bowel in the central abdomen, including opacified loops of small bowel in the left mid abdomen. Contrast has not yet reached the colon. Degenerative changes of the lumbar spine. Status post ORIF of the left hip IMPRESSION: Dilated loops of small bowel,  as above, compatible with small bowel obstruction. Contrast has not yet reached the colon. Electronically Signed   By: Charline BillsSriyesh  Krishnan M.D.   On: 10/24/2015 09:07   Dg Vangie BickerNaso G Tube Plc W/fl-no Rad  10/24/2015  CLINICAL DATA:  NASO G TUBE PLACEMENT WITH FLUORO Fluoroscopy was utilized by the requesting physician.  No radiographic interpretation.    EKG:  Atrial fibrillation with CVR, anterior infarct, LVH with repolarization abnormality vs. Inferolateral ischemia.  ASSESSMENT/PLAN:   1.  New onset atrial fibrillation of unknown duration.  Patient is asymptomatic with this. This patients CHA2DS2-VASc Score and unadjusted Ischemic Stroke Rate (% per year) is equal to 3.2 % stroke rate/year from a score of 3 (age>75 and vascular disease).  Above score calculated as 1 point each if present [CHF,  HTN, DM, Vascular=MI/PAD/Aortic Plaque, Age if 99-74, or Male].  Above score calculated as 2 points each if present [Age > 75, or Stroke/TIA/TE].  Continue IV Heparin for now but prior to D/C change to NOAC.  Will need to be on anticoagulation for 4 weeks prior to DCCV if he does not convert spontaneously. Would be cautious with use of digoxin in setting of AKI.  If BP stabilized hopefully can switch to lopressor IV.    2.  Abnormal EKG with ST/T wave changes of ischemia.  This could be all repolarization from LVH but also need to rule out ischemia.  He does admit to some chest pain with exertion recently.  Will check a 2D echo to assess for LVF, LVH and LA size.  If LVF normal then suggest nuclear stress test prior to changing to NOAC in case he needs cath.  3.  SBO - per TRH  4.  AKI most likely secondary to dehydration from N/V - continue IVF hydration  5.  AAA 3.4cm -  Check FLP in am.  BP controlled and on soft side  6.  Elevated troponin that is minimally elevated with flat trend that is most likely secondary to demand ischemia in setting of acute illness with SBO/afib and acute kidney injury with  decreased clearance.  2D echo pending.  Continue to cycle.   Again he does admit to some chest pain with exertion recently.   If LVF normal then suggest nuclear stress test prior to changing to NOAC in case he needs cath.   Quintella Reichert, MD  10/24/2015  9:40 PM

## 2015-10-24 NOTE — Progress Notes (Signed)
ANTICOAGULATION CONSULT NOTE - Follow Up Consult  Pharmacy Consult for heparin Indication: atrial fibrillation  Labs:  Recent Labs  10/23/15 1628 10/23/15 2015 10/23/15 2024 10/24/15 0316  HGB 17.7*  --  17.3* 14.3  HCT 51.6  --  51.0 41.7  PLT 228  --   --  188  HEPARINUNFRC  --   --   --  0.14*  CREATININE 3.39*  --  2.60*  --   TROPONINI  --  0.06*  --   --      Assessment: 82yo male subtherapeutic on heparin with initial dosing for Afib.  Goal of Therapy:  Heparin level 0.3-0.7 units/ml   Plan:  Will rebolus with heparin 1000 units and increase gtt by 3 units/kg/hr to 900 units/hr and check level in 8hr.  Vernard GamblesVeronda Charmelle Soh, PharmD, BCPS  10/24/2015,4:04 AM

## 2015-10-24 NOTE — Progress Notes (Signed)
Utilization review completed.  

## 2015-10-24 NOTE — Progress Notes (Signed)
ANTICOAGULATION CONSULT NOTE - Follow Up Consult  Pharmacy Consult for heparin Indication: atrial fibrillation  No Known Allergies  Patient Measurements: Height: 5\' 6"  (167.6 cm) Weight: 124 lb 9 oz (56.5 kg) IBW/kg (Calculated) : 63.8 Heparin Dosing Weight:   Vital Signs: Temp: 98.7 F (37.1 C) (03/12 1217) Temp Source: Oral (03/12 1217) BP: 108/66 mmHg (03/12 1217) Pulse Rate: 85 (03/12 1217)  Labs:  Recent Labs  10/23/15 1628 10/23/15 2015 10/23/15 2024 10/24/15 0316 10/24/15 1207  HGB 17.7*  --  17.3* 14.3  --   HCT 51.6  --  51.0 41.7  --   PLT 228  --   --  188  --   HEPARINUNFRC  --   --   --  0.14* 0.28*  CREATININE 3.39*  --  2.60* 2.70*  --   TROPONINI  --  0.06*  --  0.06*  --     Estimated Creatinine Clearance: 16.3 mL/min (by C-G formula based on Cr of 2.7).   Medications:  Scheduled:  . aspirin  150 mg Rectal Daily  . digoxin  0.125 mg Intravenous Q6H  . [START ON 10/25/2015] digoxin  0.125 mg Oral Daily  . pantoprazole (PROTONIX) IV  40 mg Intravenous Q24H  . piperacillin-tazobactam (ZOSYN)  IV  2.25 g Intravenous 3 times per day    Assessment: 82yo admitted with SBO, on heparin for AFib.  Heparin level remains just below goal this AM, on 900 units/hr.  Hg & pltc wnl.  No bleeding noted.  Goal of Therapy:  Heparin level 0.3-0.7 units/ml Monitor platelets by anticoagulation protocol: Yes   Plan:  Increase heparin to 1000 units/hr Repeat HL in 8hr Watch for s/s of bleeding  Marisue HumbleKendra Jaylaa Gallion, PharmD Clinical Pharmacist [facility] [place] [place]

## 2015-10-24 NOTE — Progress Notes (Signed)
Patient Demographics:    Jesse Barber, is a 82 y.o. male, DOB - February 16, 1929, ZOX:[id number]  Admit date - 10/23/2015   Admitting Physician Bobette Moavid Manuel Ortiz, MD  Outpatient Primary MD for the patient is Jearld Leschwight M Williams, MD  LOS - 1   Chief Complaint  Patient presents with  . Abdominal Pain  . Emesis        Subjective:    Jesse Barber today has, No headache, No chest pain, No abdominal pain - No Nausea, No new weakness tingling or numbness, No Cough - SOB. Become indicated through Google translate.   Assessment  & Plan :     1.Small bowel obstruction. Discussed with family members and patient, patient has had no previous abdominal surgeries, has been fairly healthy without any weight loss, at this time general surgery on board, he is nothing by mouth with IV fluids for hydration, monitoring electrolytes, will try to pass NG tube via fluoroscopy as 3 NG tube placement attempted bedside failed. Son has consented for the same. Repeat KUB this morning still shows SBO, defer further management to general surgery.  2. Newly diagnosed paroxysmal atrial fibrillation. [place] 2 score of at least 2. Was in RVR, placed on IV digoxin and as needed IV Lopressor, check TSH and echogram, cardiology consulted. Continue heparin drip for now.  3. Dehydration, ARF and hypokalemia due to emesis from SBO. Hydrate and monitor. Hold off on nephrotoxins. Monitor CMP.  4. History of ongoing smoking and COPD. At baseline, supportive care only. Counseled to quit smoking.  5. AAA 3.4 cm aneurysm diagnosed on CT scan. Outpatient follow-up and monitoring, will place on beta blocker once he is taking medications orally.  6. Mild nonspecific elevation of troponin in non-ACS pattern due to A. fib RVR. Chest pain-free, EKG  nonacute, supportive care, aspirin suppository for now. Beta blocker when able to take orally.  7. History of dementia. Resume home medications once taking orally, at risk for delirium.    Code Status : Full  Family Communication  : Son over the phone  Disposition Plan  : Stay in stepdown  Consults  :  Gen. surgery, cardiology  Procedures  :   CT scan abdomen and pelvis confirming SBO and bowel dilation  Echogram pending  DVT Prophylaxis  :   Heparin  gtt  Lab Results  Component Value Date   PLT 188 10/24/2015    Inpatient Medications  Scheduled Meds: . dextrose 5 % and 0.45% NaCl   Intravenous STAT  . digoxin  0.125 mg Intravenous Q6H  . [START ON 10/25/2015] digoxin  0.125 mg Oral Daily  . pantoprazole (PROTONIX) IV  40 mg Intravenous Q24H  . piperacillin-tazobactam (ZOSYN)  IV  2.25 g Intravenous 3 times per day  . potassium chloride  10 mEq Intravenous Q1 Hr x 5   Continuous Infusions: . heparin 900 Units/hr (10/24/15 0501)   PRN Meds:.metoprolol, morphine injection, ondansetron **OR** ondansetron (ZOFRAN) IV  Antibiotics  :     Anti-infectives    Start     Dose/Rate Route Frequency Ordered Stop   10/24/15 0100  piperacillin-tazobactam (ZOSYN) IVPB 2.25 g     2.25 g 100 mL/hr over 30 Minutes Intravenous 3 times per day 10/23/15 1837  10/23/15 1700  piperacillin-tazobactam (ZOSYN) IVPB 3.375 g     3.375 g 100 mL/hr over 30 Minutes Intravenous  Once 10/23/15 1654 10/23/15 1810        Objective:   Filed Vitals:   10/24/15 0400 10/24/15 0600 10/24/15 0800 10/24/15 0913  BP: 104/76 104/71 105/75 121/70  Pulse: 107 135 86 101  Temp: 98.5 F (36.9 C)   98.7 F (37.1 C)  TempSrc: Oral   Oral  Resp: Height:      Weight:      SpO2: 98%   93%    Wt Readings from Last 3 Encounters:  10/23/15 56.5 kg (124 lb 9 oz)  10/23/15 56.246 kg (124 lb)     Intake/Output Summary (Last 24 hours) at 10/24/15 0945 Last data filed at 10/24/15  0606  Gross per 24 hour  Intake   3194 ml  Output    150 ml  Net   3044 ml     Physical Exam  Awake Alert, Oriented X 3, No new F.N deficits, Normal affect [place].AT,PERRAL Supple Neck,No JVD, No cervical lymphadenopathy appriciated.  Symmetrical Chest wall movement, Good air movement bilaterally, CTAB RRR,No Gallops,Rubs or new Murmurs, No Parasternal Heave Hypoactive B.Sounds, Abd Soft with mild distension, No tenderness, No organomegaly appriciated, No rebound - guarding or rigidity. No Cyanosis, Clubbing or edema, No new Rash or bruise       Data Review:   Micro Results Recent Results (from the past 240 hour(s))  MRSA PCR Screening     Status: None   Collection Time: 10/24/15  1:00 AM  Result Value Ref Range Status   MRSA by PCR NEGATIVE NEGATIVE Final    Comment:        The GeneXpert MRSA Assay (FDA approved for NASAL specimens only), is one component of a comprehensive MRSA colonization surveillance program. It is not intended to diagnose MRSA infection nor to guide or monitor treatment for MRSA infections.     Radiology Reports Ct Abdomen Pelvis Wo Contrast  10/23/2015  CLINICAL DATA:  Nausea and vomiting with generalized abdominal pain. EXAM: CT ABDOMEN AND PELVIS WITHOUT CONTRAST TECHNIQUE: Multidetector CT imaging of the abdomen and pelvis was performed following the standard protocol without IV contrast. COMPARISON:  Acute abdomen series from earlier today. FINDINGS: Lower chest: Emphysema noted in the lung bases with bronchial wall thickening and dependent atelectasis. Hepatobiliary: No focal abnormality in the liver on this study without intravenous contrast. No evidence of hepatomegaly. Gallbladder is distended. No intrahepatic or extrahepatic biliary dilation. Pancreas: No focal mass lesion. No dilatation of the main duct. No intraparenchymal cyst. No peripancreatic edema. Spleen: No splenomegaly. No focal mass lesion. Adrenals/Urinary Tract: No adrenal nodule or  mass. No renal stones or hydronephrosis. No definite renal mass lesion on this study without intravenous contrast material. No evidence for hydroureter. The urinary bladder appears normal for the degree of distention. Stomach/Bowel: Mild circumferential wall thickening is seen in the distal esophagus and there is a small hiatal hernia. Stomach otherwise unremarkable. Duodenum is normally positioned as is the ligament of Treitz. Proximal jejunal loops are opacified and not substantially dilated. However, mid small bowel is air-filled and dilated up to 4.2 cm in diameter. The distal ileal loops are completely decompressed and the terminal ileum is visible on image 41 series 21. No definite transition zone can be identified. The colon is largely decompressed throughout. No evidence for bowel wall thickening. No pneumatosis. Vascular/Lymphatic: Abdominal aortic atherosclerosis noted with  infrarenal abdominal aorta measuring up to 3.4 cm in diameter. There is no gastrohepatic or hepatoduodenal ligament lymphadenopathy. No intraperitoneal or retroperitoneal lymphadenopathy. No pelvic sidewall lymphadenopathy. Reproductive: The prostate gland and seminal vesicles have normal imaging features. Other: No intraperitoneal free fluid. No evidence for interloop mesenteric fluid Musculoskeletal: Patient is status post ORIF for proximal left femur fracture. Bones are diffusely demineralized Bone windows reveal no worrisome lytic or sclerotic osseous lesions. IMPRESSION: 1. Dilated small bowel up to 4.2 cm compatible with small bowel obstruction. The point of obstruction is probably in the mid to distal small bowel although a discrete transition zone cannot be identified on the current exam. There is no associated pneumatosis or bowel wall thickening. No interloop mesenteric fluid. 2. Circumferential wall thickening in the distal esophagus associated with small hiatal hernia. Changes in the esophagus may be related and reflux. 3.  3.4 cm abdominal aortic aneurysm. Recommend followup by ultrasound in 3 years. This recommendation follows ACR consensus guidelines: White Paper of the ACR Incidental Findings Committee II on Vascular Findings. [place] [place] Coll Radiol 2013; 10:789-794 Electronically Signed   By: Kennith Center M.D.   On: 10/23/2015 18:57   Dg Chest Port 1 View  10/23/2015  CLINICAL DATA:  Sepsis. EXAM: PORTABLE CHEST 1 VIEW COMPARISON:  Chest and abdominal radiographs earlier this day. Included lung bases from CT abdomen. FINDINGS: Lower lung volumes from prior exam. Developing ill-defined and linear bibasilar opacities. No pulmonary edema. No confluent airspace disease. No pleural effusion or pneumothorax. Dilated bowel loops in the right upper quadrant. IMPRESSION: Developing ill-defined linear bibasilar opacities, likely atelectasis. Given bowel obstruction, aspiration could have a similar appearance. Electronically Signed   By: Rubye Oaks M.D.   On: 10/23/2015 23:59   Dg Abd Acute W/chest  10/23/2015  CLINICAL DATA:  82 year old male with history of abdominal pain. EXAM: DG ABDOMEN ACUTE W/ 1V CHEST COMPARISON:  No priors. FINDINGS: Several small dense nodules in the right hemithorax are most compatible with calcified granulomas. Bilateral nipple shadows incidentally noted. No acute consolidative airspace disease. No pleural effusions. No evidence of pulmonary edema. No pneumothorax. Heart size is normal. Upper mediastinal contours are within normal limits. Atherosclerosis in the thoracic aorta. Numerous dilated loops of small bowel measuring up to 6.3 cm in diameter. There is a small amount of colonic and rectal gas and stool noted. No frank pneumoperitoneum confidently identified. Orthopedic fixation hardware in the left hip incompletely visualize. Numerous vascular calcifications are noted. IMPRESSION: 1. Appearance of the abdomen is compatible with small bowel obstruction, as above. Given the presence of gas and stool in  the colon, obstruction may be early, or could be partial. 2. No pneumoperitoneum. 3. No radiographic evidence of acute cardiopulmonary disease. 4. Atherosclerosis. Electronically Signed   By: Trudie Reed M.D.   On: 10/23/2015 14:39   Dg Abd Portable 1v  10/24/2015  CLINICAL DATA:  Small bowel obstruction EXAM: PORTABLE ABDOMEN - 1 VIEW COMPARISON:  CT abdomen pelvis dated 10/23/2015 FINDINGS: Dilated loops of small bowel in the central abdomen, including opacified loops of small bowel in the left mid abdomen. Contrast has not yet reached the colon. Degenerative changes of the lumbar spine. Status post ORIF of the left hip IMPRESSION: Dilated loops of small bowel, as above, compatible with small bowel obstruction. Contrast has not yet reached the colon. Electronically Signed   By: Charline Bills M.D.   On: 10/24/2015 09:07     CBC  Recent Labs Lab 10/23/15 1421 10/23/15 1628  10/23/15 2024 10/24/15 0316  WBC 20.2* 19.6*  --  12.6*  HGB 17.7 17.7* 17.3* 14.3  HCT 50.4 51.6 51.0 41.7  PLT  --  228  --  188  MCV 91.6 91.3  --  90.7  MCH 32.2* 31.3  --  31.1  MCHC 35.2 34.3  --  34.3  RDW  --  14.6  --  14.6  LYMPHSABS  --   --   --  1.0  MONOABS  --   --   --  0.9  EOSABS  --   --   --  0.0  BASOSABS  --   --   --  0.0    Chemistries   Recent Labs Lab 10/23/15 1628 10/23/15 2015 10/23/15 2024 10/24/15 0316  NA 133*  --  133* 133*  K 3.8  --  3.4* 3.2*  CL 85*  --  95* 93*  CO2 25  --   --  25  GLUCOSE 149*  --  119* 156*  BUN 86*  --  69* 74*  CREATININE 3.39*  --  2.60* 2.70*  CALCIUM 9.7  --   --  8.1*  MG  --  1.9  --   --   AST 25  --   --  17  ALT 12*  --   --  11*  ALKPHOS 75  --   --  54  BILITOT 2.2*  --   --  1.6*   ------------------------------------------------------------------------------------------------------------------ No results for input(s): CHOL, HDL, LDLCALC, TRIG, CHOLHDL, LDLDIRECT in the last 72 hours.  No results found for:  HGBA1C ------------------------------------------------------------------------------------------------------------------ No results for input(s): TSH, T4TOTAL, T3FREE, THYROIDAB in the last 72 hours.  Invalid input(s): FREET3 ------------------------------------------------------------------------------------------------------------------ No results for input(s): VITAMINB12, FOLATE, FERRITIN, TIBC, IRON, RETICCTPCT in the last 72 hours.  Coagulation profile No results for input(s): INR, PROTIME in the last 168 hours.  No results for input(s): DDIMER in the last 72 hours.  Cardiac Enzymes  Recent Labs Lab 10/23/15 2015 10/24/15 0316  TROPONINI 0.06* 0.06*   ------------------------------------------------------------------------------------------------------------------ No results found for: BNP  Time Spent in minutes  35   Susa Raring K M.D on 10/24/2015 at 9:45 AM  Between 7am to 7pm - Pager - [phone]  After 7pm go to [email or web address] - password [facility]  [facility] -  Office  [phone]

## 2015-10-24 NOTE — Progress Notes (Signed)
Paged Dr Thedore MinsSingh for aspirin order clarification  Reinforced dose of 150 mg to be given. Pharmacy called to print label and send since unable to obtain for pyxis due to "problem with amount ordered" displayed on screen.

## 2015-10-24 NOTE — Progress Notes (Signed)
Pt arrived to 2C07 from ED.  VS's all WNL, afebrile, alert, no english, pt says he is confused which is his baseline. RN attempted to place NGT 2x and was unsuccessful. Family explained the procedure to the pt but he would not be still, and coughed it up each time. RN will continue to monitor.

## 2015-10-24 NOTE — Progress Notes (Signed)
Pt pulled out NGT. Will need IR placement tomorrow again and restraints if it's going to stay in. Or a Recruitment consultantsafety sitter.

## 2015-10-24 NOTE — Progress Notes (Signed)
Just below the minimum with urine output = 150 ml over 6 hours.

## 2015-10-25 ENCOUNTER — Encounter (HOSPITAL_COMMUNITY): Payer: Self-pay | Admitting: Cardiology

## 2015-10-25 ENCOUNTER — Inpatient Hospital Stay (HOSPITAL_COMMUNITY): Payer: Medicare Other

## 2015-10-25 ENCOUNTER — Ambulatory Visit (HOSPITAL_COMMUNITY): Payer: Medicare Other

## 2015-10-25 DIAGNOSIS — K5669 Other intestinal obstruction: Secondary | ICD-10-CM

## 2015-10-25 DIAGNOSIS — R9431 Abnormal electrocardiogram [ECG] [EKG]: Secondary | ICD-10-CM

## 2015-10-25 DIAGNOSIS — I4891 Unspecified atrial fibrillation: Secondary | ICD-10-CM

## 2015-10-25 DIAGNOSIS — R7989 Other specified abnormal findings of blood chemistry: Secondary | ICD-10-CM

## 2015-10-25 DIAGNOSIS — J449 Chronic obstructive pulmonary disease, unspecified: Secondary | ICD-10-CM

## 2015-10-25 DIAGNOSIS — R778 Other specified abnormalities of plasma proteins: Secondary | ICD-10-CM

## 2015-10-25 DIAGNOSIS — I714 Abdominal aortic aneurysm, without rupture: Secondary | ICD-10-CM

## 2015-10-25 DIAGNOSIS — I509 Heart failure, unspecified: Secondary | ICD-10-CM

## 2015-10-25 DIAGNOSIS — I2781 Cor pulmonale (chronic): Secondary | ICD-10-CM

## 2015-10-25 HISTORY — DX: Other specified abnormalities of plasma proteins: R77.8

## 2015-10-25 HISTORY — DX: Unspecified atrial fibrillation: I48.91

## 2015-10-25 HISTORY — DX: Abnormal electrocardiogram (ECG) (EKG): R94.31

## 2015-10-25 LAB — ECHOCARDIOGRAM COMPLETE
Height: 66 in
WEIGHTICAEL: 1992.96 [oz_av]

## 2015-10-25 LAB — URINE CULTURE

## 2015-10-25 LAB — CBC WITH DIFFERENTIAL/PLATELET
Basophils Absolute: 0 10*3/uL (ref 0.0–0.1)
Basophils Relative: 0 %
Eosinophils Absolute: 0.1 10*3/uL (ref 0.0–0.7)
Eosinophils Relative: 1 %
HCT: 40.1 % (ref 39.0–52.0)
HEMOGLOBIN: 13.2 g/dL (ref 13.0–17.0)
LYMPHS ABS: 0.8 10*3/uL (ref 0.7–4.0)
LYMPHS PCT: 13 %
MCH: 30.4 pg (ref 26.0–34.0)
MCHC: 32.9 g/dL (ref 30.0–36.0)
MCV: 92.4 fL (ref 78.0–100.0)
MONOS PCT: 10 %
Monocytes Absolute: 0.6 10*3/uL (ref 0.1–1.0)
NEUTROS PCT: 76 %
Neutro Abs: 5 10*3/uL (ref 1.7–7.7)
Platelets: 188 10*3/uL (ref 150–400)
RBC: 4.34 MIL/uL (ref 4.22–5.81)
RDW: 14.3 % (ref 11.5–15.5)
WBC: 6.5 10*3/uL (ref 4.0–10.5)

## 2015-10-25 LAB — COMPREHENSIVE METABOLIC PANEL
ALK PHOS: 43 U/L (ref 38–126)
ALT: 9 U/L — AB (ref 17–63)
AST: 13 U/L — AB (ref 15–41)
Albumin: 2.7 g/dL — ABNORMAL LOW (ref 3.5–5.0)
Anion gap: 9 (ref 5–15)
BILIRUBIN TOTAL: 1.9 mg/dL — AB (ref 0.3–1.2)
BUN: 42 mg/dL — AB (ref 6–20)
CALCIUM: 7.9 mg/dL — AB (ref 8.9–10.3)
CHLORIDE: 102 mmol/L (ref 101–111)
CO2: 27 mmol/L (ref 22–32)
CREATININE: 1.84 mg/dL — AB (ref 0.61–1.24)
GFR calc Af Amer: 37 mL/min — ABNORMAL LOW (ref >60)
GFR, EST NON AFRICAN AMERICAN: 32 mL/min — AB (ref >60)
Glucose, Bld: 96 mg/dL (ref 65–99)
Potassium: 3.7 mmol/L (ref 3.5–5.1)
Sodium: 138 mmol/L (ref 135–145)
TOTAL PROTEIN: 5.6 g/dL — AB (ref 6.5–8.1)

## 2015-10-25 LAB — HEPARIN LEVEL (UNFRACTIONATED)
HEPARIN UNFRACTIONATED: 0.28 [IU]/mL — AB (ref 0.30–0.70)
Heparin Unfractionated: 0.15 IU/mL — ABNORMAL LOW (ref 0.30–0.70)

## 2015-10-25 LAB — MAGNESIUM: Magnesium: 1.9 mg/dL (ref 1.7–2.4)

## 2015-10-25 LAB — DIGOXIN LEVEL: Digoxin Level: 0.4 ng/mL — ABNORMAL LOW (ref 0.8–2.0)

## 2015-10-25 MED ORDER — DEXTROSE 5 % IV SOLN
1.0000 g | INTRAVENOUS | Status: DC
  Administered 2015-10-25 – 2015-10-26 (×2): 1 g via INTRAVENOUS
  Filled 2015-10-25 (×3): qty 10

## 2015-10-25 MED ORDER — POTASSIUM CHLORIDE IN NACL 40-0.9 MEQ/L-% IV SOLN
INTRAVENOUS | Status: DC
  Administered 2015-10-25 – 2015-10-26 (×2): 50 mL/h via INTRAVENOUS
  Filled 2015-10-25 (×2): qty 1000

## 2015-10-25 MED ORDER — ASPIRIN 81 MG PO CHEW
81.0000 mg | CHEWABLE_TABLET | Freq: Every day | ORAL | Status: DC
  Administered 2015-10-27 – 2015-10-28 (×2): 81 mg via ORAL
  Filled 2015-10-25 (×2): qty 1

## 2015-10-25 MED ORDER — METOPROLOL TARTRATE 25 MG PO TABS
25.0000 mg | ORAL_TABLET | Freq: Two times a day (BID) | ORAL | Status: DC
Start: 2015-10-25 — End: 2015-10-28
  Administered 2015-10-25 – 2015-10-28 (×6): 25 mg via ORAL
  Filled 2015-10-25 (×6): qty 1

## 2015-10-25 NOTE — Progress Notes (Signed)
ANTICOAGULATION CONSULT NOTE - Follow Up Consult  Pharmacy Consult for heparin Indication: atrial fibrillation  Labs:  Recent Labs  10/23/15 1628 10/23/15 2015 10/23/15 2024  10/24/15 0316 10/24/15 1207 10/24/15 2028 10/25/15 0233 10/25/15 0701  HGB 17.7*  --  17.3*  --  14.3  --   --  13.2  --   HCT 51.6  --  51.0  --  41.7  --   --  40.1  --   PLT 228  --   --   --  188  --   --  188  --   HEPARINUNFRC  --   --   --   < > 0.14* 0.28* 0.95*  --  0.28*  CREATININE 3.39*  --  2.60*  --  2.70*  --   --  1.84*  --   TROPONINI  --  0.06*  --   --  0.06*  --   --   --   --   < > = values in this interval not displayed.   Assessment: 82yo male now subtherapeutic on heparin after rate decrease for high level.  Goal of Therapy:  Heparin level 0.3-0.7 units/ml   Plan:  Will increase heparin gtt slightly to 900 units/hr and check level in 8hr.  Vernard GamblesVeronda Camar [place], PharmD, BCPS  10/25/2015,7:32 AM

## 2015-10-25 NOTE — Progress Notes (Signed)
  Echocardiogram 2D Echocardiogram has been performed.  Jesse SavoyCasey Barber [name] 10/25/2015, 12:12 PM

## 2015-10-25 NOTE — Progress Notes (Addendum)
Pt is keep pulling his IV and heart monitor leads. Nurse used interpreter to explain need for both. Pt agreed not to take leads off or pulled IV out. 5 minutes later pt is off from the monitor again. Nurse called son to explain need. Pt has dementia. Pt still taking all the leads out. Will cont to monitor pt closely. Thank you.

## 2015-10-25 NOTE — Progress Notes (Signed)
ANTICOAGULATION CONSULT NOTE - Follow Up Consult  Pharmacy Consult for heparin Indication: atrial fibrillation  No Known Allergies  Patient Measurements: Height: 5\' 6"  (167.6 cm) Weight: 124 lb 9 oz (56.5 kg) IBW/kg (Calculated) : 63.8   Vital Signs: Temp: 97.4 F (36.3 C) (03/13 1200) Temp Source: Oral (03/13 1200) BP: 124/73 mmHg (03/13 1200) Pulse Rate: 70 (03/13 1200)  Labs:  Recent Labs  10/23/15 1628 10/23/15 2015 10/23/15 2024 10/24/15 0316  10/24/15 2028 10/25/15 0233 10/25/15 0701 10/25/15 1558  HGB 17.7*  --  17.3* 14.3  --   --  13.2  --   --   HCT 51.6  --  51.0 41.7  --   --  40.1  --   --   PLT 228  --   --  188  --   --  188  --   --   HEPARINUNFRC  --   --   --  0.14*  < > 0.95*  --  0.28* 0.15*  CREATININE 3.39*  --  2.60* 2.70*  --   --  1.84*  --   --   TROPONINI  --  0.06*  --  0.06*  --   --   --   --   --   < > = values in this interval not displayed.  Estimated Creatinine Clearance: 23.9 mL/min (by C-G formula based on Cr of 1.84).   Medications:  Scheduled:  . aspirin  81 mg Oral Daily  . cefTRIAXone (ROCEPHIN)  IV  1 g Intravenous Q24H  . digoxin  0.0625 mg Oral Daily  . metoprolol tartrate  25 mg Oral BID  . pantoprazole (PROTONIX) IV  40 mg Intravenous Q24H    Assessment: 82yo admitted with SBO, on heparin for AFib.    Heparin level decreased this evening despite a rate increase earlier- confirmed with RN it is running at 900 units/hr and there were no issues with line. No bleeding noted. Patient previously had a supratherapeutic level on 1000 units/hr.  Goal of Therapy:  Heparin level 0.3-0.7 units/ml Monitor platelets by anticoagulation protocol: Yes   Plan:  Increase heparin slightly to 950 units/hr Next HL with AM labs Daily HL and CBC  Roth Ress D. Brecken Walth, PharmD, BCPS Clinical Pharmacist Pager: [phone] 10/25/2015 5:18 PM

## 2015-10-25 NOTE — Progress Notes (Signed)
Patient Profile: [age] yomale with a past medical history of tobacco use, dementia, depression, osteoarthritis and COPD who presented to the emergency department 10/23/15 via EMS due to abdominal pain w/ n/v. He was found to have a SBO and new onset atrial fibrillation w/ RVR. Also incidental finding of a 3.4 cm AAA w/o rupture.   Subjective: Patient does not speak english but his son is present by bedside and assisted with interpretation.   Patient has no major complaints. Resting comfortably. Still with mild abdominal discomfort, but much improved. Asymptomatic with his afib. No chest pain or dyspnea currently.   Objective: Vital signs in last 24 hours: Temp:  [97 F (36.1 C)-98.7 F (37.1 C)] 98.4 F (36.9 C) (03/13 0700) Pulse Rate:  [58-100] 77 (03/13 1133) Resp:  [16-23] 18 (03/13 0700) BP: (95-170)/(63-82) 125/79 mmHg (03/13 1133) SpO2:  [98 %-100 %] 100 % (03/13 0700) Last BM Date: 10/22/15  Intake/Output from previous day: 03/12 0701 - 03/13 0700 In: 3103.5 [I.V.:2553.5; IV Piggyback:550] Out: 2445 [Urine:1945; Emesis/NG output:500] Intake/Output this shift:    Medications Current Facility-Administered Medications  Medication Dose Route Frequency Provider Last Rate Last Dose  . 0.9 % NaCl with KCl 40 mEq / L  infusion   Intravenous Continuous Leroy SeaPrashant K Singh, MD 50 mL/hr at 10/25/15 1129 50 mL/hr at 10/25/15 1129  . aspirin chewable tablet 81 mg  81 mg Oral Daily Leroy SeaPrashant K Singh, MD   0 mg at 10/25/15 1129  . cefTRIAXone (ROCEPHIN) 1 g in dextrose 5 % 50 mL IVPB  1 g Intravenous Q24H Leroy SeaPrashant K Singh, MD      . digoxin (LANOXIN) tablet 0.0625 mg  0.0625 mg Oral Daily Leroy SeaPrashant K Singh, MD   0.0625 mg at 10/25/15 1009  . heparin ADULT infusion 100 units/mL (25000 units/250 mL)  900 Units/hr Intravenous Continuous Juliette MangleVeronda P Bryk, RPH 9 mL/hr at 10/25/15 0800 900 Units/hr at 10/25/15 0800  . metoprolol (LOPRESSOR) injection 5 mg  5 mg Intravenous Q2H PRN Leroy SeaPrashant K Singh,  MD      . metoprolol tartrate (LOPRESSOR) tablet 25 mg  25 mg Oral BID Leroy SeaPrashant K Singh, MD   25 mg at 10/25/15 1133  . morphine 2 MG/ML injection 1 mg  1 mg Intravenous Q3H PRN Leroy SeaPrashant K Singh, MD      . ondansetron [facility](ZOFRAN) injection 4 mg  4 mg Intravenous Q6H PRN Bobette Moavid Manuel Ortiz, MD      . pantoprazole (PROTONIX) injection 40 mg  40 mg Intravenous Q24H Bobette Moavid Manuel Ortiz, MD   40 mg at 10/24/15 2131    PE: General appearance: alert, cooperative and no distress Neck: no carotid bruit and no JVD Lungs: clear to auscultation bilaterally Heart: irregularly irregular rhythm and regular rate Abdomen: absent bowel sounds, mild diffuse tenderness with palpation, no distention Extremities: no LEE Pulses: 2+ and symmetric Skin: warm and dry Neurologic: Grossly normal  Lab Results:   Recent Labs  10/23/15 1628 10/23/15 2024 10/24/15 0316 10/25/15 0233  WBC 19.6*  --  12.6* 6.5  HGB 17.7* 17.3* 14.3 13.2  HCT 51.6 51.0 41.7 40.1  PLT 228  --  188 188   BMET  Recent Labs  10/23/15 1628 10/23/15 2024 10/24/15 0316 10/24/15 1207 10/25/15 0233  NA 133* 133* 133*  --  138  K 3.8 3.4* 3.2* 3.7 3.7  CL 85* 95* 93*  --  102  CO2 25  --  25  --  27  GLUCOSE 149* 119*  156*  --  96  BUN 86* 69* 74*  --  42*  CREATININE 3.39* 2.60* 2.70*  --  1.84*  CALCIUM 9.7  --  8.1*  --  7.9*   Cardiac Panel (last 3 results)  Recent Labs  10/23/15 2015 10/24/15 0316  TROPONINI 0.06* 0.06*    Studies/Results: 2D Echo- pending   Assessment/Plan  Principal Problem:   SBO (small bowel obstruction) (HCC) Active Problems:   Dementia   Language barrier to communication   COPD (chronic obstructive pulmonary disease) (HCC)   AKI (acute kidney injury) (HCC)   Nausea and vomiting in adult   Tobacco abuse disorder   AAA (abdominal aortic aneurysm) without rupture (HCC)   Abnormal EKG   Atrial fibrillation with rapid ventricular response (HCC)   Elevated troponin   1. New onset  atrial fibrillation of unknown duration. Patient is asymptomatic with this. His rate is well controlled on BB therapy. This patients CHA2DS2-VASc Score and unadjusted Ischemic Stroke Rate (% per year) is equal to 3.2 % stroke rate/year from a score of 3 (age>75 and vascular disease). Above score calculated as 1 point each if present [CHF, HTN, DM, Vascular=MI/PAD/Aortic Plaque, Age if 65-74, or Male]. Above score calculated as 2 points each if present [Age > 75, or Stroke/TIA/TE]. Continue IV Heparin for now but prior to D/C change to NOAC. Will need to be on anticoagulation for 4 weeks prior to DCCV if he does not convert spontaneously. Would be cautious with use of digoxin in setting of AKI.Continue metoprolol for rate control. 2D echo pending to assess EF.   2. Elevated Troponin: mildly elevated x 2 but with flat trend at 0.06>>0.06. This is also in the setting of AKI with Scr at 2.70>>1.8. Enzyme elevation is not c/w ACS. This is likely secondary to demand ischemia from acute illnesses w/ SBO/afib and acute kidney injury with decreased clearance. However patient did report at time of consult recent exertional CP.  Per Dr. Mayford Knife, "If LVF normal then suggest nuclear stress test prior to changing to NOAC in case he needs cath". Echo results pending.   3. AAA: incidental finding on CT of abdomen and pelvis. 3.4 cm in diameter. F/u ultrasound recommended in 3 years. He has a h/o tobacco abuse which is a major risk factor. Smoking cessation will need to be stressed. BP is controlled. Will check FLP in the am.   4. SBO: followed by general surgery. Patient initially treated conservatively with NGT, but patient pulled it out. F/u abdominal plain film earlier today showed persistent SBO. ? Surgical management.   5. AKI: likely secondary from dehydration from severe n/v. IVFs per IM. This is improving. SCr 2.70>>1.84.    LOS: 2 days    Brittainy M. Delmer Islam 10/25/2015 12:17 PM  I have seen  and examined the patient along with Brittainy M. Sharol Harness, PA-C.  I have reviewed the chart, notes and new data.  I agree with PA's note.  Key new complaints: calm, no distress Key examination changes: irregular rhythm, no overt CHF Key new findings / data: echo shows normal LVEF and wall motion. The left atrium is normal, but right heart chambers are moderately dilated and there is moderate pulmonary HTN.  PLAN: atrial fibrillation appears to be related to chronic cor pulmonale, acutely triggered by hyperadrenergic state and metabolic imbalances created by SBO. Cor pulmonale may be due to smoking-related COPD. Minor troponin leak and reported history of chest pain - will perform lexiscan myoview tomorrow if  he is able to lie flat for the test. If no ischemia is seen, will transition to PO direct oral anticoagulants once we are confident he does not need abdominal surgery.  Thurmon Fair, MD, [facility] [phone] 10/25/2015, 2:40 PM

## 2015-10-25 NOTE — Progress Notes (Signed)
Patient ID: Jesse Barber, male   DOB: 1928-09-20, 82 y.o.   MRN: [id number]     [facility]      [address] [place], [place] [address]    Phone: [phone] FAX: [phone]     Subjective: Passing flatus since yesterday. No n/v.  Denies abdominal pain. Endorses to chest pressure and palpitations with activity prior to admission.   Objective:  Vital signs:  Filed Vitals:   10/25/15 0300 10/25/15 0400 10/25/15 0500 10/25/15 0600  BP: 117/67 170/74 113/67 118/75  Pulse: 95 99    Temp: 98.5 F (36.9 C)     TempSrc: Oral     Resp: _0 Height:      Weight:      SpO2:        Last BM Date: 10/22/15  Intake/Output   Yesterday:  03/12 0701 - 03/13 0700 In: 3103.5 [I.V.:2553.5; IV Piggyback:550] Out: 4076 [KGSUP:1031; Emesis/NG output:500] This shift:   Physical Exam: General: Pt awake/alert/oriented x4 in no  acute distress  Abdomen: Soft.  Nondistended.  Non tender.  No evidence of peritonitis.  No incarcerated hernias.    Problem List:   Principal Problem:   SBO (small bowel obstruction) (HCC) Active Problems:   Dementia   Language barrier to communication   COPD (chronic obstructive pulmonary disease) (HCC)   AKI (acute kidney injury) ([place])   Nausea and vomiting in adult   Tobacco abuse disorder   AAA (abdominal aortic aneurysm) without rupture ([place])    Results:   Labs: Results for orders placed or performed during the hospital encounter of 10/23/15 (from the past 48 hour(s))  Lipase, blood     Status: None   Collection Time: 10/23/15  4:28 PM  Result Value Ref Range   Lipase 25 11 - 51 U/L  Comprehensive metabolic panel     Status: Abnormal   Collection Time: 10/23/15  4:28 PM  Result Value Ref Range   Sodium 133 (L) 135 - 145 mmol/L   Potassium 3.8 3.5 - 5.1 mmol/L   Chloride 85 (L) 101 - 111 mmol/L   CO2 25 22 - 32 mmol/L   Glucose, Bld 149 (H) 65 - 99 mg/dL   BUN 86 (H) 6 - 20 mg/dL    Creatinine, Ser 3.39 (H) 0.61 - 1.24 mg/dL   Calcium 9.7 8.9 - 10.3 mg/dL   Total Protein 7.2 6.5 - 8.1 g/dL   Albumin 4.1 3.5 - 5.0 g/dL   AST 25 15 - 41 U/L   ALT 12 (L) 17 - 63 U/L   Alkaline Phosphatase 75 38 - 126 U/L   Total Bilirubin 2.2 (H) 0.3 - 1.2 mg/dL   GFR calc non Af Amer 15 (L) >60 mL/min   GFR calc Af Amer 18 (L) >60 mL/min    Comment: (NOTE) The eGFR has been calculated using the CKD EPI equation. This calculation has not been validated in all clinical situations. eGFR's persistently <60 mL/min signify possible Chronic Kidney Disease.    Anion gap 23 (H) 5 - 15  CBC     Status: Abnormal   Collection Time: 10/23/15  4:28 PM  Result Value Ref Range   WBC 19.6 (H) 4.0 - 10.5 K/uL   RBC 5.65 4.22 - 5.81 MIL/uL   Hemoglobin 17.7 (H) 13.0 - 17.0 g/dL   HCT 51.6 39.0 - 52.0 %   MCV 91.3 78.0 - 100.0 fL   MCH 31.3 26.0 -  34.0 pg   MCHC 34.3 30.0 - 36.0 g/dL   RDW 99.9 67.2 - 27.7 %   Platelets 228 150 - 400 K/uL  I-Stat CG4 Lactic Acid, ED (Not at [facility])     Status: Abnormal   Collection Time: 10/23/15  4:42 PM  Result Value Ref Range   Lactic Acid, Venous 3.76 (HH) 0.5 - 2.0 mmol/L   Comment NOTIFIED PHYSICIAN   Lactic acid, plasma     Status: Abnormal   Collection Time: 10/23/15  8:15 PM  Result Value Ref Range   Lactic Acid, Venous 2.1 (HH) 0.5 - 2.0 mmol/L    Comment: CRITICAL RESULT CALLED TO, READ BACK BY AND VERIFIED WITH: M.WHITE,RN 10/23/15 @2120  BY V.WILKINS   Magnesium     Status: None   Collection Time: 10/23/15  8:15 PM  Result Value Ref Range   Magnesium 1.9 1.7 - 2.4 mg/dL  Phosphorus     Status: None   Collection Time: 10/23/15  8:15 PM  Result Value Ref Range   Phosphorus 4.3 2.5 - 4.6 mg/dL  Troponin I     Status: Abnormal   Collection Time: 10/23/15  8:15 PM  Result Value Ref Range   Troponin I 0.06 (H) <0.031 ng/mL    Comment:        PERSISTENTLY INCREASED TROPONIN VALUES IN THE RANGE OF 0.04-0.49 ng/mL CAN BE SEEN IN:        -UNSTABLE ANGINA       -CONGESTIVE HEART FAILURE       -MYOCARDITIS       -CHEST TRAUMA       -ARRYHTHMIAS       -LATE PRESENTING MYOCARDIAL INFARCTION       -COPD   CLINICAL FOLLOW-UP RECOMMENDED.   I-stat chem 8, ed     Status: Abnormal   Collection Time: 10/23/15  8:24 PM  Result Value Ref Range   Sodium 133 (L) 135 - 145 mmol/L   Potassium 3.4 (L) 3.5 - 5.1 mmol/L   Chloride 95 (L) 101 - 111 mmol/L   BUN 69 (H) 6 - 20 mg/dL   Creatinine, Ser 12/23/15 (H) 0.61 - 1.24 mg/dL   Glucose, Bld 3.75 (H) 65 - 99 mg/dL   Calcium, Ion 051 (L) 1.13 - 1.30 mmol/L   TCO2 23 0 - 100 mmol/L   Hemoglobin 17.3 (H) 13.0 - 17.0 g/dL   HCT 0.71 25.2 - 47.9 %  Urinalysis, Routine w reflex microscopic (not at [facility]     Status: Abnormal   Collection Time: 10/23/15  9:16 PM  Result Value Ref Range   Color, Urine YELLOW YELLOW   APPearance CLOUDY (A) CLEAR   Specific Gravity, Urine 1.020 1.005 - 1.030   pH 5.0 5.0 - 8.0   Glucose, UA NEGATIVE NEGATIVE mg/dL   Hgb urine dipstick NEGATIVE NEGATIVE   Bilirubin Urine NEGATIVE NEGATIVE   Ketones, ur NEGATIVE NEGATIVE mg/dL   Protein, ur NEGATIVE NEGATIVE mg/dL   Nitrite NEGATIVE NEGATIVE   Leukocytes, UA SMALL (A) NEGATIVE  Sodium, urine, random     Status: None   Collection Time: 10/23/15  9:16 PM  Result Value Ref Range   Sodium, Ur 15 mmol/L  Creatinine, urine, random     Status: None   Collection Time: 10/23/15  9:16 PM  Result Value Ref Range   Creatinine, Urine 133.94 mg/dL  Urine microscopic-add on     Status: Abnormal   Collection Time: 10/23/15  9:16 PM  Result Value Ref Range  Squamous Epithelial / LPF 6-30 (A) NONE SEEN   WBC, UA 6-30 0 - 5 WBC/hpf   RBC / HPF 0-5 0 - 5 RBC/hpf   Bacteria, UA FEW (A) NONE SEEN   Casts HYALINE CASTS (A) NEGATIVE  I-Stat CG4 Lactic Acid, ED  (not at  [facility])     Status: Abnormal   Collection Time: 10/23/15  9:26 PM  Result Value Ref Range   Lactic Acid, Venous 3.35 (HH) 0.5 - 2.0 mmol/L   Comment  NOTIFIED PHYSICIAN   Lactic acid, plasma     Status: None   Collection Time: 10/23/15 10:23 PM  Result Value Ref Range   Lactic Acid, Venous 1.8 0.5 - 2.0 mmol/L  MRSA PCR Screening     Status: None   Collection Time: 10/24/15  1:00 AM  Result Value Ref Range   MRSA by PCR NEGATIVE NEGATIVE    Comment:        The GeneXpert MRSA Assay (FDA approved for NASAL specimens only), is one component of a comprehensive MRSA colonization surveillance program. It is not intended to diagnose MRSA infection nor to guide or monitor treatment for MRSA infections.   Heparin level (unfractionated)     Status: Abnormal   Collection Time: 10/24/15  3:16 AM  Result Value Ref Range   Heparin Unfractionated 0.14 (L) 0.30 - 0.70 IU/mL    Comment:        IF HEPARIN RESULTS ARE BELOW EXPECTED VALUES, AND PATIENT DOSAGE HAS BEEN CONFIRMED, SUGGEST FOLLOW UP TESTING OF ANTITHROMBIN III LEVELS.   Troponin I     Status: Abnormal   Collection Time: 10/24/15  3:16 AM  Result Value Ref Range   Troponin I 0.06 (H) <0.031 ng/mL    Comment:        PERSISTENTLY INCREASED TROPONIN VALUES IN THE RANGE OF 0.04-0.49 ng/mL CAN BE SEEN IN:       -UNSTABLE ANGINA       -CONGESTIVE HEART FAILURE       -MYOCARDITIS       -CHEST TRAUMA       -ARRYHTHMIAS       -LATE PRESENTING MYOCARDIAL INFARCTION       -COPD   CLINICAL FOLLOW-UP RECOMMENDED.   Comprehensive metabolic panel     Status: Abnormal   Collection Time: 10/24/15  3:16 AM  Result Value Ref Range   Sodium 133 (L) 135 - 145 mmol/L   Potassium 3.2 (L) 3.5 - 5.1 mmol/L   Chloride 93 (L) 101 - 111 mmol/L   CO2 25 22 - 32 mmol/L   Glucose, Bld 156 (H) 65 - 99 mg/dL   BUN 74 (H) 6 - 20 mg/dL   Creatinine, Ser 2.70 (H) 0.61 - 1.24 mg/dL   Calcium 8.1 (L) 8.9 - 10.3 mg/dL   Total Protein 5.6 (L) 6.5 - 8.1 g/dL   Albumin 3.1 (L) 3.5 - 5.0 g/dL   AST 17 15 - 41 U/L   ALT 11 (L) 17 - 63 U/L   Alkaline Phosphatase 54 38 - 126 U/L   Total Bilirubin 1.6  (H) 0.3 - 1.2 mg/dL   GFR calc non Af Amer 20 (L) >60 mL/min   GFR calc Af Amer 23 (L) >60 mL/min    Comment: (NOTE) The eGFR has been calculated using the CKD EPI equation. This calculation has not been validated in all clinical situations. eGFR's persistently <60 mL/min signify possible Chronic Kidney Disease.    Anion gap 15 5 - 15  CBC WITH DIFFERENTIAL     Status: Abnormal   Collection Time: 10/24/15  3:16 AM  Result Value Ref Range   WBC 12.6 (H) 4.0 - 10.5 K/uL   RBC 4.60 4.22 - 5.81 MIL/uL   Hemoglobin 14.3 13.0 - 17.0 g/dL   HCT 41.7 39.0 - 52.0 %   MCV 90.7 78.0 - 100.0 fL   MCH 31.1 26.0 - 34.0 pg   MCHC 34.3 30.0 - 36.0 g/dL   RDW 14.6 11.5 - 15.5 %   Platelets 188 150 - 400 K/uL   Neutrophils Relative % 85 %   Neutro Abs 10.7 (H) 1.7 - 7.7 K/uL   Lymphocytes Relative 8 %   Lymphs Abs 1.0 0.7 - 4.0 K/uL   Monocytes Relative 7 %   Monocytes Absolute 0.9 0.1 - 1.0 K/uL   Eosinophils Relative 0 %   Eosinophils Absolute 0.0 0.0 - 0.7 K/uL   Basophils Relative 0 %   Basophils Absolute 0.0 0.0 - 0.1 K/uL  TSH     Status: None   Collection Time: 10/24/15  7:30 AM  Result Value Ref Range   TSH 2.622 0.350 - 4.500 uIU/mL  Heparin level (unfractionated)     Status: Abnormal   Collection Time: 10/24/15 12:07 PM  Result Value Ref Range   Heparin Unfractionated 0.28 (L) 0.30 - 0.70 IU/mL    Comment:        IF HEPARIN RESULTS ARE BELOW EXPECTED VALUES, AND PATIENT DOSAGE HAS BEEN CONFIRMED, SUGGEST FOLLOW UP TESTING OF ANTITHROMBIN III LEVELS.   Potassium     Status: None   Collection Time: 10/24/15 12:07 PM  Result Value Ref Range   Potassium 3.7 3.5 - 5.1 mmol/L  Heparin level (unfractionated)     Status: Abnormal   Collection Time: 10/24/15  8:28 PM  Result Value Ref Range   Heparin Unfractionated 0.95 (H) 0.30 - 0.70 IU/mL    Comment:        IF HEPARIN RESULTS ARE BELOW EXPECTED VALUES, AND PATIENT DOSAGE HAS BEEN CONFIRMED, SUGGEST FOLLOW UP TESTING OF  ANTITHROMBIN III LEVELS.   CBC WITH DIFFERENTIAL     Status: None   Collection Time: 10/25/15  2:33 AM  Result Value Ref Range   WBC 6.5 4.0 - 10.5 K/uL   RBC 4.34 4.22 - 5.81 MIL/uL   Hemoglobin 13.2 13.0 - 17.0 g/dL   HCT 40.1 39.0 - 52.0 %   MCV 92.4 78.0 - 100.0 fL   MCH 30.4 26.0 - 34.0 pg   MCHC 32.9 30.0 - 36.0 g/dL   RDW 14.3 11.5 - 15.5 %   Platelets 188 150 - 400 K/uL   Neutrophils Relative % 76 %   Neutro Abs 5.0 1.7 - 7.7 K/uL   Lymphocytes Relative 13 %   Lymphs Abs 0.8 0.7 - 4.0 K/uL   Monocytes Relative 10 %   Monocytes Absolute 0.6 0.1 - 1.0 K/uL   Eosinophils Relative 1 %   Eosinophils Absolute 0.1 0.0 - 0.7 K/uL   Basophils Relative 0 %   Basophils Absolute 0.0 0.0 - 0.1 K/uL  Magnesium     Status: None   Collection Time: 10/25/15  2:33 AM  Result Value Ref Range   Magnesium 1.9 1.7 - 2.4 mg/dL  Comprehensive metabolic panel     Status: Abnormal   Collection Time: 10/25/15  2:33 AM  Result Value Ref Range   Sodium 138 135 - 145 mmol/L   Potassium 3.7 3.5 - 5.1 mmol/L  Chloride 102 101 - 111 mmol/L   CO2 27 22 - 32 mmol/L   Glucose, Bld 96 65 - 99 mg/dL   BUN 42 (H) 6 - 20 mg/dL   Creatinine, Ser 1.84 (H) 0.61 - 1.24 mg/dL   Calcium 7.9 (L) 8.9 - 10.3 mg/dL   Total Protein 5.6 (L) 6.5 - 8.1 g/dL   Albumin 2.7 (L) 3.5 - 5.0 g/dL   AST 13 (L) 15 - 41 U/L   ALT 9 (L) 17 - 63 U/L   Alkaline Phosphatase 43 38 - 126 U/L   Total Bilirubin 1.9 (H) 0.3 - 1.2 mg/dL   GFR calc non Af Amer 32 (L) >60 mL/min   GFR calc Af Amer 37 (L) >60 mL/min    Comment: (NOTE) The eGFR has been calculated using the CKD EPI equation. This calculation has not been validated in all clinical situations. eGFR's persistently <60 mL/min signify possible Chronic Kidney Disease.    Anion gap 9 5 - 15  Heparin level (unfractionated)     Status: Abnormal   Collection Time: 10/25/15  7:01 AM  Result Value Ref Range   Heparin Unfractionated 0.28 (L) 0.30 - 0.70 IU/mL    Comment:         IF HEPARIN RESULTS ARE BELOW EXPECTED VALUES, AND PATIENT DOSAGE HAS BEEN CONFIRMED, SUGGEST FOLLOW UP TESTING OF ANTITHROMBIN III LEVELS.     Imaging / Studies: Ct Abdomen Pelvis Wo Contrast  10/23/2015  CLINICAL DATA:  Nausea and vomiting with generalized abdominal pain. EXAM: CT ABDOMEN AND PELVIS WITHOUT CONTRAST TECHNIQUE: Multidetector CT imaging of the abdomen and pelvis was performed following the standard protocol without IV contrast. COMPARISON:  Acute abdomen series from earlier today. FINDINGS: Lower chest: Emphysema noted in the lung bases with bronchial wall thickening and dependent atelectasis. Hepatobiliary: No focal abnormality in the liver on this study without intravenous contrast. No evidence of hepatomegaly. Gallbladder is distended. No intrahepatic or extrahepatic biliary dilation. Pancreas: No focal mass lesion. No dilatation of the main duct. No intraparenchymal cyst. No peripancreatic edema. Spleen: No splenomegaly. No focal mass lesion. Adrenals/Urinary Tract: No adrenal nodule or mass. No renal stones or hydronephrosis. No definite renal mass lesion on this study without intravenous contrast material. No evidence for hydroureter. The urinary bladder appears normal for the degree of distention. Stomach/Bowel: Mild circumferential wall thickening is seen in the distal esophagus and there is a small hiatal hernia. Stomach otherwise unremarkable. Duodenum is normally positioned as is the ligament of Treitz. Proximal jejunal loops are opacified and not substantially dilated. However, mid small bowel is air-filled and dilated up to 4.2 cm in diameter. The distal ileal loops are completely decompressed and the terminal ileum is visible on image 41 series 21. No definite transition zone can be identified. The colon is largely decompressed throughout. No evidence for bowel wall thickening. No pneumatosis. Vascular/Lymphatic: Abdominal aortic atherosclerosis noted with infrarenal  abdominal aorta measuring up to 3.4 cm in diameter. There is no gastrohepatic or hepatoduodenal ligament lymphadenopathy. No intraperitoneal or retroperitoneal lymphadenopathy. No pelvic sidewall lymphadenopathy. Reproductive: The prostate gland and seminal vesicles have normal imaging features. Other: No intraperitoneal free fluid. No evidence for interloop mesenteric fluid Musculoskeletal: Patient is status post ORIF for proximal left femur fracture. Bones are diffusely demineralized Bone windows reveal no worrisome lytic or sclerotic osseous lesions. IMPRESSION: 1. Dilated small bowel up to 4.2 cm compatible with small bowel obstruction. The point of obstruction is probably in the mid to distal small bowel  although a discrete transition zone cannot be identified on the current exam. There is no associated pneumatosis or bowel wall thickening. No interloop mesenteric fluid. 2. Circumferential wall thickening in the distal esophagus associated with small hiatal hernia. Changes in the esophagus may be related and reflux. 3. 3.4 cm abdominal aortic aneurysm. Recommend followup by ultrasound in 3 years. This recommendation follows ACR consensus guidelines: White Paper of the ACR Incidental Findings Committee II on Vascular Findings. Natasha [employer] Coll Radiol 2013; 10:789-794 Electronically Signed   By: Misty Stanley M.D.   On: 10/23/2015 18:57   Dg Chest Port 1 View  10/23/2015  CLINICAL DATA:  Sepsis. EXAM: PORTABLE CHEST 1 VIEW COMPARISON:  Chest and abdominal radiographs earlier this day. Included lung bases from CT abdomen. FINDINGS: Lower lung volumes from prior exam. Developing ill-defined and linear bibasilar opacities. No pulmonary edema. No confluent airspace disease. No pleural effusion or pneumothorax. Dilated bowel loops in the right upper quadrant. IMPRESSION: Developing ill-defined linear bibasilar opacities, likely atelectasis. Given bowel obstruction, aspiration could have a similar appearance.  Electronically Signed   By: Jeb Levering M.D.   On: 10/23/2015 23:59   Dg Abd Acute W/chest  10/23/2015  CLINICAL DATA:  82 year old male with history of abdominal pain. EXAM: DG ABDOMEN ACUTE W/ 1V CHEST COMPARISON:  No priors. FINDINGS: Several small dense nodules in the right hemithorax are most compatible with calcified granulomas. Bilateral nipple shadows incidentally noted. No acute consolidative airspace disease. No pleural effusions. No evidence of pulmonary edema. No pneumothorax. Heart size is normal. Upper mediastinal contours are within normal limits. Atherosclerosis in the thoracic aorta. Numerous dilated loops of small bowel measuring up to 6.3 cm in diameter. There is a small amount of colonic and rectal gas and stool noted. No frank pneumoperitoneum confidently identified. Orthopedic fixation hardware in the left hip incompletely visualize. Numerous vascular calcifications are noted. IMPRESSION: 1. Appearance of the abdomen is compatible with small bowel obstruction, as above. Given the presence of gas and stool in the colon, obstruction may be early, or could be partial. 2. No pneumoperitoneum. 3. No radiographic evidence of acute cardiopulmonary disease. 4. Atherosclerosis. Electronically Signed   By: Vinnie Langton M.D.   On: 10/23/2015 14:39   Dg Abd Portable 1v  10/24/2015  CLINICAL DATA:  Small bowel obstruction EXAM: PORTABLE ABDOMEN - 1 VIEW COMPARISON:  CT abdomen pelvis dated 10/23/2015 FINDINGS: Dilated loops of small bowel in the central abdomen, including opacified loops of small bowel in the left mid abdomen. Contrast has not yet reached the colon. Degenerative changes of the lumbar spine. Status post ORIF of the left hip IMPRESSION: Dilated loops of small bowel, as above, compatible with small bowel obstruction. Contrast has not yet reached the colon. Electronically Signed   By: Julian Hy M.D.   On: 10/24/2015 09:07   Dg Addison Bailey G Tube Plc W/fl-no Rad  10/24/2015   CLINICAL DATA:  NASO G TUBE PLACEMENT WITH FLUORO Fluoroscopy was utilized by the requesting physician.  No radiographic interpretation.    Medications / Allergies:  Scheduled Meds: . aspirin  150 mg Rectal Daily  . digoxin  0.0625 mg Oral Daily  . pantoprazole (PROTONIX) IV  40 mg Intravenous Q24H  . piperacillin-tazobactam (ZOSYN)  IV  2.25 g Intravenous 3 times per day   Continuous Infusions: . 0.9 % NaCl with KCl 40 mEq / L 75 mL/hr (10/24/15 2131)  . heparin 800 Units/hr (10/24/15 2200)   PRN Meds:.metoprolol, morphine injection, [DISCONTINUED] ondansetron **OR**  ondansetron (ZOFRAN) IV  Antibiotics: Anti-infectives    Start     Dose/Rate Route Frequency Ordered Stop   10/24/15 0100  piperacillin-tazobactam (ZOSYN) IVPB 2.25 g     2.25 g 100 mL/hr over 30 Minutes Intravenous 3 times per day 10/23/15 1837     10/23/15 1700  piperacillin-tazobactam (ZOSYN) IVPB 3.375 g     3.375 g 100 mL/hr over 30 Minutes Intravenous  Once 10/23/15 1654 10/23/15 1810        Assessment/Plan SBO-no previous surgeries.  Passing flatus.  Abdomen is soft and non tender.  Await AXR.  If contrast has progressed, will start clears.   Erby Pian, [facility] Pager [phone]) For consults and floor pages call [phone]([id number])  10/25/2015 7:52 AM

## 2015-10-25 NOTE — Progress Notes (Addendum)
Patient Demographics:    Jesse Barber, is a 82 y.o. male, DOB - 1929-06-14, ZDG:[id number]  Admit date - 10/23/2015   Admitting Physician Bobette Mo, MD  Outpatient Primary MD for the patient is [name], MD  LOS - 2   Chief Complaint  Patient presents with  . Abdominal Pain  . Emesis        Subjective:    [place] today has, No headache, No chest pain, No abdominal pain - No Nausea, No new weakness tingling or numbness, No Cough - SOB. Become indicated through Google translate.   Assessment  & Plan :     1.Small bowel obstruction. Discussed with family members and patient, patient has had no previous abdominal surgeries, has been fairly healthy without any weight loss, at this time general surgery on board, Patient pulled NG tube, seen by general surgery, now passing flatness, placed on clears will monitor.  2. Newly diagnosed paroxysmal atrial fibrillation. [place] vasc 2 score of at least 2. Was in RVR, placed on IV/PO digoxin along with low-dose beta blocker and as needed IV Lopressor, stable TSH , pending echogram, cardiology consulted. Continue heparin drip for now. Per cardiology he may require a stress test prior to discharge as well. Thereafter can be transitioned to a oral anticoagulant.  3. Dehydration, ARF and hypokalemia due to emesis from SBO. Improving with hydration note we do not have a baseline creatinine, he might have some element of CK D stage unknown.  4. History of ongoing smoking and COPD. At baseline, supportive care only. Counseled to quit smoking.  5. AAA 3.4 cm aneurysm diagnosed on CT scan. Outpatient follow-up and monitoring, will place on beta blocker .  6. Mild nonspecific elevation of troponin in non-ACS pattern due to A. fib RVR. Chest pain-free,  EKG nonacute, supportive care, place on aspirin and beta blocker, per cardiology will require a stress test in the next few days.  7. UTI - switched from Zosyn to Rocephin x 4 days, monitor cultures.   8. History of dementia. Resume home medications once taking orally, at risk for delirium.    Code Status : Full  Family Communication  : Son over the phone  Disposition Plan  : Stay in stepdown  Consults  :  Gen. surgery, cardiology  Procedures  :   CT scan abdomen and pelvis confirming SBO and bowel dilation  Echogram pending  DVT Prophylaxis  :   Heparin  gtt  Lab Results  Component Value Date   PLT 188 10/25/2015    Inpatient Medications  Scheduled Meds: . aspirin  150 mg Rectal Daily  . digoxin  0.0625 mg Oral Daily  . pantoprazole (PROTONIX) IV  40 mg Intravenous Q24H  . piperacillin-tazobactam (ZOSYN)  IV  2.25 g Intravenous 3 times per day   Continuous Infusions: . 0.9 % NaCl with KCl 40 mEq / L    . heparin 900 Units/hr (10/25/15 0800)   PRN Meds:.metoprolol, morphine injection, [DISCONTINUED] ondansetron **OR** ondansetron (ZOFRAN) IV  Antibiotics  :     Anti-infectives    Start     Dose/Rate Route Frequency Ordered Stop   10/24/15 0100  piperacillin-tazobactam (ZOSYN) IVPB 2.25 g     2.25 g 100  mL/hr over 30 Minutes Intravenous 3 times per day 10/23/15 1837     10/23/15 1700  piperacillin-tazobactam (ZOSYN) IVPB 3.375 g     3.375 g 100 mL/hr over 30 Minutes Intravenous  Once 10/23/15 1654 10/23/15 1810        Objective:   Filed Vitals:   10/25/15 0500 10/25/15 0600 10/25/15 0700 10/25/15 1009  BP: 113/67 118/75 117/74   Pulse:   67 83  Temp:   98.4 F (36.9 C)   TempSrc:   Oral   Resp: 19 17 18    Height:      Weight:      SpO2:   100%     Wt Readings from Last 3 Encounters:  10/23/15 56.5 kg (124 lb 9 oz)  10/23/15 56.246 kg (124 lb)     Intake/Output Summary (Last 24 hours) at 10/25/15 1112 Last data filed at 10/25/15 0700  Gross  per 24 hour  Intake 2120.16 ml  Output   2445 ml  Net -324.84 ml     Physical Exam  Awake Alert, Oriented X 3, No new F.N deficits, Normal affect [place].AT,PERRAL Supple Neck,No JVD, No cervical lymphadenopathy appriciated.  Symmetrical Chest wall movement, Good air movement bilaterally, CTAB RRR,No Gallops,Rubs or new Murmurs, No Parasternal Heave Hypoactive B.Sounds, Abd Soft with mild distension, No tenderness, No organomegaly appriciated, No rebound - guarding or rigidity. No Cyanosis, Clubbing or edema, No new Rash or bruise       Data Review:   Micro Results Recent Results (from the past 240 hour(s))  Blood Culture (routine x 2)     Status: None (Preliminary result)   Collection Time: 10/23/15  4:25 PM  Result Value Ref Range Status   Specimen Description BLOOD RIGHT FOREARM  Final   Special Requests BOTTLES DRAWN AEROBIC AND ANAEROBIC 5CC  Final   Culture NO GROWTH 2 DAYS  Final   Report Status PENDING  Incomplete  Blood Culture (routine x 2)     Status: None (Preliminary result)   Collection Time: 10/23/15  4:26 PM  Result Value Ref Range Status   Specimen Description BLOOD LEFT FOREARM  Final   Special Requests BOTTLES DRAWN AEROBIC ONLY 5CC  Final   Culture NO GROWTH 2 DAYS  Final   Report Status PENDING  Incomplete  MRSA PCR Screening     Status: None   Collection Time: 10/24/15  1:00 AM  Result Value Ref Range Status   MRSA by PCR NEGATIVE NEGATIVE Final    Comment:        The GeneXpert MRSA Assay (FDA approved for NASAL specimens only), is one component of a comprehensive MRSA colonization surveillance program. It is not intended to diagnose MRSA infection nor to guide or monitor treatment for MRSA infections.     Radiology Reports Ct Abdomen Pelvis Wo Contrast  10/23/2015  CLINICAL DATA:  Nausea and vomiting with generalized abdominal pain. EXAM: CT ABDOMEN AND PELVIS WITHOUT CONTRAST TECHNIQUE: Multidetector CT imaging of the abdomen and pelvis was  performed following the standard protocol without IV contrast. COMPARISON:  Acute abdomen series from earlier today. FINDINGS: Lower chest: Emphysema noted in the lung bases with bronchial wall thickening and dependent atelectasis. Hepatobiliary: No focal abnormality in the liver on this study without intravenous contrast. No evidence of hepatomegaly. Gallbladder is distended. No intrahepatic or extrahepatic biliary dilation. Pancreas: No focal mass lesion. No dilatation of the main duct. No intraparenchymal cyst. No peripancreatic edema. Spleen: No splenomegaly. No focal mass lesion. Adrenals/Urinary  Tract: No adrenal nodule or mass. No renal stones or hydronephrosis. No definite renal mass lesion on this study without intravenous contrast material. No evidence for hydroureter. The urinary bladder appears normal for the degree of distention. Stomach/Bowel: Mild circumferential wall thickening is seen in the distal esophagus and there is a small hiatal hernia. Stomach otherwise unremarkable. Duodenum is normally positioned as is the ligament of Treitz. Proximal jejunal loops are opacified and not substantially dilated. However, mid small bowel is air-filled and dilated up to 4.2 cm in diameter. The distal ileal loops are completely decompressed and the terminal ileum is visible on image 41 series 21. No definite transition zone can be identified. The colon is largely decompressed throughout. No evidence for bowel wall thickening. No pneumatosis. Vascular/Lymphatic: Abdominal aortic atherosclerosis noted with infrarenal abdominal aorta measuring up to 3.4 cm in diameter. There is no gastrohepatic or hepatoduodenal ligament lymphadenopathy. No intraperitoneal or retroperitoneal lymphadenopathy. No pelvic sidewall lymphadenopathy. Reproductive: The prostate gland and seminal vesicles have normal imaging features. Other: No intraperitoneal free fluid. No evidence for interloop mesenteric fluid Musculoskeletal: Patient  is status post ORIF for proximal left femur fracture. Bones are diffusely demineralized Bone windows reveal no worrisome lytic or sclerotic osseous lesions. IMPRESSION: 1. Dilated small bowel up to 4.2 cm compatible with small bowel obstruction. The point of obstruction is probably in the mid to distal small bowel although a discrete transition zone cannot be identified on the current exam. There is no associated pneumatosis or bowel wall thickening. No interloop mesenteric fluid. 2. Circumferential wall thickening in the distal esophagus associated with small hiatal hernia. Changes in the esophagus may be related and reflux. 3. 3.4 cm abdominal aortic aneurysm. Recommend followup by ultrasound in 3 years. This recommendation follows ACR consensus guidelines: White Paper of the ACR Incidental Findings Committee II on Vascular Findings. [place] Coll Radiol 2013; 10:789-794 Electronically Signed   By: Kennith Center M.D.   On: 10/23/2015 18:57   Dg Chest Port 1 View  10/23/2015  CLINICAL DATA:  Sepsis. EXAM: PORTABLE CHEST 1 VIEW COMPARISON:  Chest and abdominal radiographs earlier this day. Included lung bases from CT abdomen. FINDINGS: Lower lung volumes from prior exam. Developing ill-defined and linear bibasilar opacities. No pulmonary edema. No confluent airspace disease. No pleural effusion or pneumothorax. Dilated bowel loops in the right upper quadrant. IMPRESSION: Developing ill-defined linear bibasilar opacities, likely atelectasis. Given bowel obstruction, aspiration could have a similar appearance. Electronically Signed   By: Rubye Oaks M.D.   On: 10/23/2015 23:59   Dg Abd 2 Views  10/25/2015  CLINICAL DATA:  Small bowel obstruction EXAM: ABDOMEN - 2 VIEW COMPARISON:  10/24/2015; 10/23/2015; CT abdomen pelvis - 10/23/2015 FINDINGS: A small amount of enteric contrast remains within the ascending and transverse colon. Interval removal of enteric tube There is persistent moderate gaseous distention of  multiple loops of small bowel with index loop within the left mid abdomen measuring approximately 3.6 cm in diameter and additional loop within the left lower abdominal quadrant measuring approximately 5.3 cm. No pneumoperitoneum, pneumatosis or portal venous gas. Vascular calcifications overlie the lower pelvis bilaterally. Several phleboliths overlie the right lower pelvis. Otherwise, no definitive abnormal intra-abdominal calcifications given overlying colonic stool burden. Limited visualization of the lower thorax is normal. No acute osseus abnormalities. Mild scoliotic curvature of the lumbar spine, convex to the left with associated moderate severe multilevel lumbar spine DDD. Post dynamic lag in cancellous screw fixation of the left femoral neck and proximal aspect  the left femur, incompletely evaluated. Similar findings worrisome for persistent high-grade small bowel obstruction. IMPRESSION: 1. Similar findings worrisome for persistent small bowel obstruction. 2. Interval removal of enteric tube. Electronically Signed   By: Simonne Come M.D.   On: 10/25/2015 08:13   Dg Abd Acute W/chest  10/23/2015  CLINICAL DATA:  82 year old male with history of abdominal pain. EXAM: DG ABDOMEN ACUTE W/ 1V CHEST COMPARISON:  No priors. FINDINGS: Several small dense nodules in the right hemithorax are most compatible with calcified granulomas. Bilateral nipple shadows incidentally noted. No acute consolidative airspace disease. No pleural effusions. No evidence of pulmonary edema. No pneumothorax. Heart size is normal. Upper mediastinal contours are within normal limits. Atherosclerosis in the thoracic aorta. Numerous dilated loops of small bowel measuring up to 6.3 cm in diameter. There is a small amount of colonic and rectal gas and stool noted. No frank pneumoperitoneum confidently identified. Orthopedic fixation hardware in the left hip incompletely visualize. Numerous vascular calcifications are noted. IMPRESSION: 1.  Appearance of the abdomen is compatible with small bowel obstruction, as above. Given the presence of gas and stool in the colon, obstruction may be early, or could be partial. 2. No pneumoperitoneum. 3. No radiographic evidence of acute cardiopulmonary disease. 4. Atherosclerosis. Electronically Signed   By: Trudie Reed M.D.   On: 10/23/2015 14:39   Dg Abd Portable 1v  10/24/2015  CLINICAL DATA:  Small bowel obstruction EXAM: PORTABLE ABDOMEN - 1 VIEW COMPARISON:  CT abdomen pelvis dated 10/23/2015 FINDINGS: Dilated loops of small bowel in the central abdomen, including opacified loops of small bowel in the left mid abdomen. Contrast has not yet reached the colon. Degenerative changes of the lumbar spine. Status post ORIF of the left hip IMPRESSION: Dilated loops of small bowel, as above, compatible with small bowel obstruction. Contrast has not yet reached the colon. Electronically Signed   By: Charline Bills M.D.   On: 10/24/2015 09:07   Dg Vangie Bicker G Tube Plc W/fl-no Rad  10/24/2015  CLINICAL DATA:  NASO G TUBE PLACEMENT WITH FLUORO Fluoroscopy was utilized by the requesting physician.  No radiographic interpretation.     CBC  Recent Labs Lab 10/23/15 1421 10/23/15 1628 10/23/15 2024 10/24/15 0316 10/25/15 0233  WBC 20.2* 19.6*  --  12.6* 6.5  HGB 17.7 17.7* 17.3* 14.3 13.2  HCT 50.4 51.6 51.0 41.7 40.1  PLT  --  228  --  188 188  MCV 91.6 91.3  --  90.7 92.4  MCH 32.2* 31.3  --  31.1 30.4  MCHC 35.2 34.3  --  34.3 32.9  RDW  --  14.6  --  14.6 14.3  LYMPHSABS  --   --   --  1.0 0.8  MONOABS  --   --   --  0.9 0.6  EOSABS  --   --   --  0.0 0.1  BASOSABS  --   --   --  0.0 0.0    Chemistries   Recent Labs Lab 10/23/15 1628 10/23/15 2015 10/23/15 2024 10/24/15 0316 10/24/15 1207 10/25/15 0233  NA 133*  --  133* 133*  --  138  K 3.8  --  3.4* 3.2* 3.7 3.7  CL 85*  --  95* 93*  --  102  CO2 25  --   --  25  --  27  GLUCOSE 149*  --  119* 156*  --  96  BUN 86*  --   69* 74*  --  42*  CREATININE 3.39*  --  2.60* 2.70*  --  1.84*  CALCIUM 9.7  --   --  8.1*  --  7.9*  MG  --  1.9  --   --   --  1.9  AST 25  --   --  17  --  13*  ALT 12*  --   --  11*  --  9*  ALKPHOS 75  --   --  54  --  43  BILITOT 2.2*  --   --  1.6*  --  1.9*   ------------------------------------------------------------------------------------------------------------------ No results for input(s): CHOL, HDL, LDLCALC, TRIG, CHOLHDL, LDLDIRECT in the last 72 hours.  No results found for: HGBA1C ------------------------------------------------------------------------------------------------------------------  Recent Labs  10/24/15 0730  TSH 2.622   ------------------------------------------------------------------------------------------------------------------ No results for input(s): VITAMINB12, FOLATE, FERRITIN, TIBC, IRON, RETICCTPCT in the last 72 hours.  Coagulation profile No results for input(s): INR, PROTIME in the last 168 hours.  No results for input(s): DDIMER in the last 72 hours.  Cardiac Enzymes  Recent Labs Lab 10/23/15 2015 10/24/15 0316  TROPONINI 0.06* 0.06*   ------------------------------------------------------------------------------------------------------------------ No results found for: BNP  Time Spent in minutes  35   Susa Raring K M.D on 10/25/2015 at 11:12 AM  Between 7am to 7pm - Pager - [phone]  After 7pm go to [email or web address] - password [facility]  [facility] -  Office  [phone]

## 2015-10-26 ENCOUNTER — Ambulatory Visit (HOSPITAL_COMMUNITY): Payer: Medicare Other

## 2015-10-26 ENCOUNTER — Inpatient Hospital Stay (HOSPITAL_COMMUNITY): Payer: Medicare Other

## 2015-10-26 DIAGNOSIS — R079 Chest pain, unspecified: Secondary | ICD-10-CM

## 2015-10-26 LAB — COMPREHENSIVE METABOLIC PANEL
ALT: 8 U/L — ABNORMAL LOW (ref 17–63)
ANION GAP: 12 (ref 5–15)
AST: 23 U/L (ref 15–41)
Albumin: 2.9 g/dL — ABNORMAL LOW (ref 3.5–5.0)
Alkaline Phosphatase: 49 U/L (ref 38–126)
BUN: 32 mg/dL — ABNORMAL HIGH (ref 6–20)
CHLORIDE: 111 mmol/L (ref 101–111)
CO2: 18 mmol/L — AB (ref 22–32)
Calcium: 8.1 mg/dL — ABNORMAL LOW (ref 8.9–10.3)
Creatinine, Ser: 1.35 mg/dL — ABNORMAL HIGH (ref 0.61–1.24)
GFR calc non Af Amer: 47 mL/min — ABNORMAL LOW (ref >60)
GFR, EST AFRICAN AMERICAN: 54 mL/min — AB (ref >60)
Glucose, Bld: 86 mg/dL (ref 65–99)
Potassium: 4.9 mmol/L (ref 3.5–5.1)
SODIUM: 141 mmol/L (ref 135–145)
Total Bilirubin: 2 mg/dL — ABNORMAL HIGH (ref 0.3–1.2)
Total Protein: 5.6 g/dL — ABNORMAL LOW (ref 6.5–8.1)

## 2015-10-26 LAB — NM MYOCAR MULTI W/SPECT W/WALL MOTION / EF
CSEPHR: 62 %
CSEPPHR: 85 {beats}/min
Estimated workload: 1 METS
Exercise duration (min): 5 min
Exercise duration (sec): 2 s
MPHR: 136 {beats}/min
Rest HR: 61 {beats}/min

## 2015-10-26 LAB — LIPID PANEL
CHOL/HDL RATIO: 4.7 ratio
CHOLESTEROL: 151 mg/dL (ref 0–200)
HDL: 32 mg/dL — AB (ref >40)
LDL CALC: 95 mg/dL (ref 0–99)
TRIGLYCERIDES: 120 mg/dL (ref <150)
VLDL: 24 mg/dL (ref 0–40)

## 2015-10-26 LAB — HEPARIN LEVEL (UNFRACTIONATED)
Heparin Unfractionated: 0.2 IU/mL — ABNORMAL LOW (ref 0.30–0.70)
Heparin Unfractionated: 0.27 IU/mL — ABNORMAL LOW (ref 0.30–0.70)

## 2015-10-26 MED ORDER — APIXABAN 2.5 MG PO TABS
2.5000 mg | ORAL_TABLET | Freq: Two times a day (BID) | ORAL | Status: DC
  Administered 2015-10-26 – 2015-10-28 (×4): 2.5 mg via ORAL
  Filled 2015-10-26 (×4): qty 1

## 2015-10-26 MED ORDER — REGADENOSON 0.4 MG/5ML IV SOLN
INTRAVENOUS | Status: AC
Start: 2015-10-26 — End: 2015-10-26
  Filled 2015-10-26: qty 5

## 2015-10-26 MED ORDER — LORAZEPAM 2 MG/ML IJ SOLN
0.5000 mg | Freq: Once | INTRAMUSCULAR | Status: AC
  Administered 2015-10-26: 0.5 mg via INTRAVENOUS
  Filled 2015-10-26: qty 1

## 2015-10-26 MED ORDER — SODIUM CHLORIDE 0.9 % IV SOLN
INTRAVENOUS | Status: AC
  Administered 2015-10-26: 14:00:00 via INTRAVENOUS

## 2015-10-26 MED ORDER — DONEPEZIL HCL 5 MG PO TABS
5.0000 mg | ORAL_TABLET | Freq: Every day | ORAL | Status: DC
  Administered 2015-10-26 – 2015-10-27 (×2): 5 mg via ORAL
  Filled 2015-10-26 (×2): qty 1

## 2015-10-26 MED ORDER — TECHNETIUM TC 99M SESTAMIBI GENERIC - CARDIOLITE
10.0000 | Freq: Once | INTRAVENOUS | Status: AC | PRN
  Administered 2015-10-26: 10 via INTRAVENOUS

## 2015-10-26 MED ORDER — AMITRIPTYLINE HCL 25 MG PO TABS
25.0000 mg | ORAL_TABLET | Freq: Every day | ORAL | Status: DC
  Administered 2015-10-26 – 2015-10-27 (×2): 25 mg via ORAL
  Filled 2015-10-26 (×2): qty 1

## 2015-10-26 MED ORDER — TECHNETIUM TC 99M SESTAMIBI GENERIC - CARDIOLITE
30.0000 | Freq: Once | INTRAVENOUS | Status: AC | PRN
  Administered 2015-10-26: 30 via INTRAVENOUS

## 2015-10-26 MED ORDER — BISACODYL 10 MG RE SUPP
10.0000 mg | Freq: Once | RECTAL | Status: AC
  Administered 2015-10-26: 10 mg via RECTAL
  Filled 2015-10-26: qty 1

## 2015-10-26 MED ORDER — DARIFENACIN HYDROBROMIDE ER 7.5 MG PO TB24
7.5000 mg | ORAL_TABLET | Freq: Every day | ORAL | Status: DC
  Administered 2015-10-27 – 2015-10-28 (×2): 7.5 mg via ORAL
  Filled 2015-10-26 (×5): qty 1

## 2015-10-26 MED ORDER — REGADENOSON 0.4 MG/5ML IV SOLN
0.4000 mg | Freq: Once | INTRAVENOUS | Status: AC
  Administered 2015-10-26: 0.4 mg via INTRAVENOUS
  Filled 2015-10-26: qty 5

## 2015-10-26 NOTE — Progress Notes (Signed)
Patient Name: Jesse Barber Date of Encounter: 10/26/2015  Principal Problem:   SBO (small bowel obstruction) (HCC) Active Problems:   Dementia   Language barrier to communication   COPD (chronic obstructive pulmonary disease) (HCC)   AKI (acute kidney injury) (HCC)   Nausea and vomiting in adult   Tobacco abuse disorder   AAA (abdominal aortic aneurysm) without rupture (HCC)   Abnormal EKG   Atrial fibrillation with rapid ventricular response (HCC)   Elevated troponin   Chronic cor pulmonale [facility](HCC)   Primary Cardiologist: Dr Mayford Knifeurner Patient Profile: 82 yo male with hx tobacco use, dementia, depression, osteoarthritis and COPD, admitted 10/23/15 via EMS due to abdominal pain w/ n/v. Dx SBO and new onset atrial fibrillation w/ RVR. Also incidental finding of a 3.4 cm AAA w/o rupture. Mild elevation in trop>>MV  SUBJECTIVE: Pt had Ativan earlier, sleeps unless roused. Was combative earlier.   OBJECTIVE Filed Vitals:   10/26/15 0416 10/26/15 0737 10/26/15 1104 10/26/15 1136  BP: 136/90 138/90 135/86 116/80  Pulse: 76 86 73 65  Temp:  97.5 F (36.4 C)    TempSrc:  Axillary    Resp: 24 14  25   Height:      Weight:      SpO2: 95% 98%  98%    Intake/Output Summary (Last 24 hours) at 10/26/15 1139 Last data filed at 10/26/15 0600  Gross per 24 hour  Intake 633.24 ml  Output   1250 ml  Net -616.76 ml   Filed Weights   10/23/15 1639 10/23/15 2315  Weight: 120 lb 3.2 oz (54.522 kg) 124 lb 9 oz (56.5 kg)    PHYSICAL EXAM General: Well developed, well nourished, male in no acute distress. Head: Normocephalic, atraumatic.  Neck: Supple without bruits, JVD not elevated. Lungs:  Resp regular and unlabored, clear anteriorly. Heart: Irregular R&R, S1, S2, no S3, S4, or murmur; no rub. Abdomen: Soft, non-tender, non-distended, BS + x 4.  Extremities: No clubbing, cyanosis, edema.  Neuro: Sleeps unless roused, had rx earlier. Moves all extremities  spontaneously.  LABS: CBC: Recent Labs  10/24/15 0316 10/25/15 0233  WBC 12.6* 6.5  NEUTROABS 10.7* 5.0  HGB 14.3 13.2  HCT 41.7 40.1  MCV 90.7 92.4  PLT 188 188   Basic Metabolic Panel: Recent Labs  10/23/15 2015  10/25/15 0233 10/26/15 0332  NA  --   < > 138 141  K  --   < > 3.7 4.9  CL  --   < > 102 111  CO2  --   < > 27 18*  GLUCOSE  --   < > 96 86  BUN  --   < > 42* 32*  CREATININE  --   < > 1.84* 1.35*  CALCIUM  --   < > 7.9* 8.1*  MG 1.9  --  1.9  --   PHOS 4.3  --   --   --   < > = values in this interval not displayed. Liver Function Tests: Recent Labs  10/25/15 0233 10/26/15 0332  AST 13* 23  ALT 9* 8*  ALKPHOS 43 49  BILITOT 1.9* 2.0*  PROT 5.6* 5.6*  ALBUMIN 2.7* 2.9*   Cardiac Enzymes: Recent Labs  10/23/15 2015 10/24/15 0316  TROPONINI 0.06* 0.06*   Fasting Lipid Panel: Recent Labs  10/26/15 0332  CHOL 151  HDL 32*  LDLCALC 95  TRIG [id number]  CHOLHDL 4.7   Thyroid Function Tests: Recent Labs  10/24/15 0730  TSH 2.622    TELE:  Atrial fib, rate generally OK    Echo: 10/25/2015 - Left ventricle: The cavity size was normal. Systolic function was  normal. The estimated ejection fraction was in the range of 55%  to 60%. Wall motion was normal; there were no regional wall  motion abnormalities. Left ventricular diastolic function  parameters were normal. - Aortic valve: There was moderate regurgitation. - Mitral valve: There was mild regurgitation. - Left atrium: The atrium was normal in size. - Right ventricle: The cavity size was moderately dilated. Wall  thickness was normal. Systolic function was moderately reduced. - Right atrium: The atrium was moderately dilated. - Tricuspid valve: There was moderate regurgitation. - Pulmonary arteries: Systolic pressure was moderately increased.  PA peak pressure: 47 mm Hg (S).  Radiology/Studies: Dg Abd 2 Views 10/25/2015  CLINICAL DATA:  Small bowel obstruction EXAM: ABDOMEN -  2 VIEW COMPARISON:  10/24/2015; 10/23/2015; CT abdomen pelvis - 10/23/2015 FINDINGS: A small amount of enteric contrast remains within the ascending and transverse colon. Interval removal of enteric tube There is persistent moderate gaseous distention of multiple loops of small bowel with index loop within the left mid abdomen measuring approximately 3.6 cm in diameter and additional loop within the left lower abdominal quadrant measuring approximately 5.3 cm. No pneumoperitoneum, pneumatosis or portal venous gas. Vascular calcifications overlie the lower pelvis bilaterally. Several phleboliths overlie the right lower pelvis. Otherwise, no definitive abnormal intra-abdominal calcifications given overlying colonic stool burden. Limited visualization of the lower thorax is normal. No acute osseus abnormalities. Mild scoliotic curvature of the lumbar spine, convex to the left with associated moderate severe multilevel lumbar spine DDD. Post dynamic lag in cancellous screw fixation of the left femoral neck and proximal aspect the left femur, incompletely evaluated. Similar findings worrisome for persistent high-grade small bowel obstruction. IMPRESSION: 1. Similar findings worrisome for persistent small bowel obstruction. 2. Interval removal of enteric tube. Electronically Signed   By: Simonne Come M.D.   On: 10/25/2015 08:13   Dg Vangie Bicker G Tube Plc W/fl-no Rad 10/24/2015  CLINICAL DATA:  NASO G TUBE PLACEMENT WITH FLUORO Fluoroscopy was utilized by the requesting physician.  No radiographic interpretation.     Current Medications:  . amitriptyline  25 mg Oral QHS  . aspirin  81 mg Oral Daily  . cefTRIAXone (ROCEPHIN)  IV  1 g Intravenous Q24H  . darifenacin  7.5 mg Oral Daily  . digoxin  0.0625 mg Oral Daily  . donepezil  5 mg Oral QHS  . metoprolol tartrate  25 mg Oral BID  . pantoprazole (PROTONIX) IV  40 mg Intravenous Q24H  . regadenoson       . sodium chloride    . heparin 1,100 Units/hr (10/26/15  0435)    ASSESSMENT AND PLAN: 1. New onset atrial fibrillation of unknown duration.  - Patient is asymptomatic with this. His rate is well controlled on BB therapy.  -This patients CHA2DS2-VASc Score and unadjusted Ischemic Stroke Rate (% per year) is equal to 3.2 % stroke rate/year from a score of 3 (age>75 and vascular disease).   - Continue IV Heparin for now but prior to D/C change to NOAC. Will need to be on anticoagulation for 4 weeks prior to DCCV if he does not convert spontaneously.  - Would be cautious with use of digoxin in setting of AKI. - Continue metoprolol for rate control. EF normal by echo, no WMA.   2. Elevated Troponin:  - mildly elevated x  2 but with flat trend at 0.06>>0.06. Enzyme elevation is not c/w ACS. This is likely secondary to demand ischemia from SBO/afib and acute kidney injury with decreased clearance. - This is also in the setting of AKI with Scr at 2.70>>1.8. -  However patient did report at time of consult recent exertional CP.  - EF nl, MV today  3. AAA: incidental finding on CT of abdomen and pelvis. 3.4 cm in diameter. F/u ultrasound recommended in 3 years. He has a h/o tobacco abuse which is a major risk factor. Smoking cessation will need to be stressed. BP is controlled. FLP with low HDL, otherwise, OK.   4. SBO: followed by general surgery. Patient initially treated conservatively with NGT, but patient pulled it out. F/u abdominal plain film earlier today showed persistent SBO. ? Surgical management.   5. AKI: likely secondary from dehydration from severe n/v. IVFs per IM. This is improving. SCr 2.70>>1.84>>1.35   Otherwise, per IM Principal Problem:   SBO (small bowel obstruction) (HCC) Active Problems:   Dementia   Language barrier to communication   COPD (chronic obstructive pulmonary disease) (HCC)   AKI (acute kidney injury) (HCC)   Nausea and vomiting in adult   Tobacco abuse disorder   AAA (abdominal aortic aneurysm) without  rupture (HCC)   Abnormal EKG   Atrial fibrillation with rapid ventricular response (HCC)   Elevated troponin   Chronic cor pulmonale (HCC)   Signed, Barrett, Rhonda , PA-C 11:39 AM 10/26/2015  I have seen and examined the patient along with Barrett, Rhonda , PA-C.  I have reviewed the chart, notes and new data.  I agree with PA's note.  Key new complaints: no angina Key examination changes: no overt HF, irregular rhythm Key new findings / data: nuclear scan shows a fixed inferior defect, no large area of reversible ischemia. EF=48% is likely inaccurate (expect poor gating during AFib). EF 55-60% on echo.   PLAN: In the absence of unstable angina or significant reversible ischemia, there is no compelling reason to perform invasive evaluation (coronary angio). Would treat conservatively: start direct oral anticoagulant instead of aspirin, continue beta blocker tailored for rate control, lipid lowering agents if needed for target LDL<100.  Thurmon Fair, MD, [facility] [phone] 10/26/2015, 4:56 PM

## 2015-10-26 NOTE — Progress Notes (Addendum)
ANTICOAGULATION CONSULT NOTE - Follow Up Consult  Pharmacy Consult for heparin Indication: atrial fibrillation  Labs:  Recent Labs  10/23/15 1628 10/23/15 2015 10/23/15 2024 10/24/15 0316  10/25/15 0233  10/25/15 1558 10/26/15 0332 10/26/15 1405  HGB 17.7*  --  17.3* 14.3  --  13.2  --   --   --   --   HCT 51.6  --  51.0 41.7  --  40.1  --   --   --   --   PLT 228  --   --  188  --  188  --   --   --   --   HEPARINUNFRC  --   --   --  0.14*  < >  --   < > 0.15* 0.20* 0.27*  CREATININE 3.39*  --  2.60* 2.70*  --  1.84*  --   --  1.35*  --   TROPONINI  --  0.06*  --  0.06*  --   --   --   --   --   --   < > = values in this interval not displayed.   Assessment: 82yo male remains subtherapeutic on heparin after rate increases; CBC stable.   Goal of Therapy:  Heparin level 0.3-0.7 units/ml   Plan:  -Increase heparin gtt to 1300 units/hr -Daily HL, CBC -Confirmatory level this evening    Agapito GamesAlison Claressa Hughley, PharmD, BCPS Clinical Pharmacist Pager: [phone] 10/26/2015 3:07 PM

## 2015-10-26 NOTE — Progress Notes (Signed)
ANTICOAGULATION CONSULT NOTE - Follow Up Consult  Pharmacy Consult for heparin Indication: atrial fibrillation  Labs:  Recent Labs  10/23/15 1628 10/23/15 2015 10/23/15 2024 10/24/15 0316  10/25/15 0233 10/25/15 0701 10/25/15 1558 10/26/15 0332  HGB 17.7*  --  17.3* 14.3  --  13.2  --   --   --   HCT 51.6  --  51.0 41.7  --  40.1  --   --   --   PLT 228  --   --  188  --  188  --   --   --   HEPARINUNFRC  --   --   --  0.14*  < >  --  0.28* 0.15* 0.20*  CREATININE 3.39*  --  2.60* 2.70*  --  1.84*  --   --   --   TROPONINI  --  0.06*  --  0.06*  --   --   --   --   --   < > = values in this interval not displayed.   Assessment: 82yo male remains subtherapeutic on heparin after rate increases; high level earlier at 1000 units/hr may have been erroneous.  Goal of Therapy:  Heparin level 0.3-0.7 units/ml   Plan:  Will increase heparin gtt by 3 units/kg/hr to 1100 units/hr and check level in 8hr.  Vernard GamblesVeronda Jabriel Vanduyne, PharmD, BCPS  10/26/2015,4:35 AM

## 2015-10-26 NOTE — Progress Notes (Signed)
Patient ID: Jesse Barber, male   DOB: [birth year], 82 y.o.   MRN: [id number]      [address] [place], [place] [address]    Phone: [phone] FAX: [phone]     Subjective: Minimal PO intake, c/o reflux and nausea.  Passing flatus. No BMs. Pt difficult to arouse, seems more confused today.  VSS.  Afebrile. Awaiting AXR.    Objective:  Vital signs:  Filed Vitals:   10/26/15 0400 10/26/15 0410 10/26/15 0416 10/26/15 0737  BP: 130/100  136/90 138/90  Pulse: 99  76 86  Temp:  97.3 F (36.3 C)  97.5 F (36.4 C)  TempSrc:  Axillary  Axillary  Resp: 26  24 14   Height:      Weight:      SpO2: 97%  95% 98%    Last BM Date: 10/25/15  Intake/Output   Yesterday:  03/13 0701 - 03/14 0700 In: 1088.2 [P.O.:120; I.V.:968.2] Out: 1250 [Urine:1250] This shift:    Physical Exam: General: Pt awake/alert/oriented x4 in no acute distress  Abdomen: Soft. Nondistended. Non tender. No evidence of peritonitis. No incarcerated hernias.  Problem List:   Principal Problem:   SBO (small bowel obstruction) (HCC) Active Problems:   Dementia   Language barrier to communication   COPD (chronic obstructive pulmonary disease) (HCC)   AKI (acute kidney injury) ([place])   Nausea and vomiting in adult   Tobacco abuse disorder   AAA (abdominal aortic aneurysm) without rupture (HCC)   Abnormal EKG   Atrial fibrillation with rapid ventricular response (HCC)   Elevated troponin   Chronic cor pulmonale ([place])    Results:   Labs: Results for orders placed or performed during the hospital encounter of 10/23/15 (from the past 48 hour(s))  Heparin level (unfractionated)     Status: Abnormal   Collection Time: 10/24/15 12:07 PM  Result Value Ref Range   Heparin Unfractionated 0.28 (L) 0.30 - 0.70 IU/mL    Comment:        IF HEPARIN RESULTS ARE BELOW EXPECTED VALUES, AND PATIENT DOSAGE HAS BEEN CONFIRMED, SUGGEST FOLLOW  UP TESTING OF ANTITHROMBIN III LEVELS.   Potassium     Status: None   Collection Time: 10/24/15 12:07 PM  Result Value Ref Range   Potassium 3.7 3.5 - 5.1 mmol/L  Heparin level (unfractionated)     Status: Abnormal   Collection Time: 10/24/15  8:28 PM  Result Value Ref Range   Heparin Unfractionated 0.95 (H) 0.30 - 0.70 IU/mL    Comment:        IF HEPARIN RESULTS ARE BELOW EXPECTED VALUES, AND PATIENT DOSAGE HAS BEEN CONFIRMED, SUGGEST FOLLOW UP TESTING OF ANTITHROMBIN III LEVELS.   CBC WITH DIFFERENTIAL     Status: None   Collection Time: 10/25/15  2:33 AM  Result Value Ref Range   WBC 6.5 4.0 - 10.5 K/uL   RBC 4.34 4.22 - 5.81 MIL/uL   Hemoglobin 13.2 13.0 - 17.0 g/dL   HCT 40.1 39.0 - 52.0 %   MCV 92.4 78.0 - 100.0 fL   MCH 30.4 26.0 - 34.0 pg   MCHC 32.9 30.0 - 36.0 g/dL   RDW 14.3 11.5 - 15.5 %   Platelets 188 150 - 400 K/uL   Neutrophils Relative % 76 %   Neutro Abs 5.0 1.7 - 7.7 K/uL   Lymphocytes Relative 13 %   Lymphs Abs 0.8 0.7 - 4.0 K/uL  Monocytes Relative 10 %   Monocytes Absolute 0.6 0.1 - 1.0 K/uL   Eosinophils Relative 1 %   Eosinophils Absolute 0.1 0.0 - 0.7 K/uL   Basophils Relative 0 %   Basophils Absolute 0.0 0.0 - 0.1 K/uL  Magnesium     Status: None   Collection Time: 10/25/15  2:33 AM  Result Value Ref Range   Magnesium 1.9 1.7 - 2.4 mg/dL  Comprehensive metabolic panel     Status: Abnormal   Collection Time: 10/25/15  2:33 AM  Result Value Ref Range   Sodium 138 135 - 145 mmol/L   Potassium 3.7 3.5 - 5.1 mmol/L   Chloride 102 101 - 111 mmol/L   CO2 27 22 - 32 mmol/L   Glucose, Bld 96 65 - 99 mg/dL   BUN 42 (H) 6 - 20 mg/dL   Creatinine, Ser 1.84 (H) 0.61 - 1.24 mg/dL   Calcium 7.9 (L) 8.9 - 10.3 mg/dL   Total Protein 5.6 (L) 6.5 - 8.1 g/dL   Albumin 2.7 (L) 3.5 - 5.0 g/dL   AST 13 (L) 15 - 41 U/L   ALT 9 (L) 17 - 63 U/L   Alkaline Phosphatase 43 38 - 126 U/L   Total Bilirubin 1.9 (H) 0.3 - 1.2 mg/dL   GFR calc non Af Amer 32 (L)  >60 mL/min   GFR calc Af Amer 37 (L) >60 mL/min    Comment: (NOTE) The eGFR has been calculated using the CKD EPI equation. This calculation has not been validated in all clinical situations. eGFR's persistently <60 mL/min signify possible Chronic Kidney Disease.    Anion gap 9 5 - 15  Heparin level (unfractionated)     Status: Abnormal   Collection Time: 10/25/15  7:01 AM  Result Value Ref Range   Heparin Unfractionated 0.28 (L) 0.30 - 0.70 IU/mL    Comment:        IF HEPARIN RESULTS ARE BELOW EXPECTED VALUES, AND PATIENT DOSAGE HAS BEEN CONFIRMED, SUGGEST FOLLOW UP TESTING OF ANTITHROMBIN III LEVELS.   Heparin level (unfractionated)     Status: Abnormal   Collection Time: 10/25/15  3:58 PM  Result Value Ref Range   Heparin Unfractionated 0.15 (L) 0.30 - 0.70 IU/mL    Comment:        IF HEPARIN RESULTS ARE BELOW EXPECTED VALUES, AND PATIENT DOSAGE HAS BEEN CONFIRMED, SUGGEST FOLLOW UP TESTING OF ANTITHROMBIN III LEVELS.   Digoxin level     Status: Abnormal   Collection Time: 10/25/15  6:01 PM  Result Value Ref Range   Digoxin Level 0.4 (L) 0.8 - 2.0 ng/mL  Comprehensive metabolic panel     Status: Abnormal   Collection Time: 10/26/15  3:32 AM  Result Value Ref Range   Sodium 141 135 - 145 mmol/L   Potassium 4.9 3.5 - 5.1 mmol/L    Comment: DELTA CHECK NOTED   Chloride 111 101 - 111 mmol/L   CO2 18 (L) 22 - 32 mmol/L   Glucose, Bld 86 65 - 99 mg/dL   BUN 32 (H) 6 - 20 mg/dL   Creatinine, Ser 1.35 (H) 0.61 - 1.24 mg/dL   Calcium 8.1 (L) 8.9 - 10.3 mg/dL   Total Protein 5.6 (L) 6.5 - 8.1 g/dL   Albumin 2.9 (L) 3.5 - 5.0 g/dL   AST 23 15 - 41 U/L   ALT 8 (L) 17 - 63 U/L   Alkaline Phosphatase 49 38 - 126 U/L   Total Bilirubin 2.0 (H)  0.3 - 1.2 mg/dL   GFR calc non Af Amer 47 (L) >60 mL/min   GFR calc Af Amer 54 (L) >60 mL/min    Comment: (NOTE) The eGFR has been calculated using the CKD EPI equation. This calculation has not been validated in all clinical  situations. eGFR's persistently <60 mL/min signify possible Chronic Kidney Disease.    Anion gap 12 5 - 15  Heparin level (unfractionated)     Status: Abnormal   Collection Time: 10/26/15  3:32 AM  Result Value Ref Range   Heparin Unfractionated 0.20 (L) 0.30 - 0.70 IU/mL    Comment:        IF HEPARIN RESULTS ARE BELOW EXPECTED VALUES, AND PATIENT DOSAGE HAS BEEN CONFIRMED, SUGGEST FOLLOW UP TESTING OF ANTITHROMBIN III LEVELS.   Lipid panel     Status: Abnormal   Collection Time: 10/26/15  3:32 AM  Result Value Ref Range   Cholesterol 151 0 - 200 mg/dL   Triglycerides 120 <150 mg/dL   HDL 32 (L) >40 mg/dL   Total CHOL/HDL Ratio 4.7 RATIO   VLDL 24 0 - 40 mg/dL   LDL Cholesterol 95 0 - 99 mg/dL    Comment:        Total Cholesterol/HDL:CHD Risk Coronary Heart Disease Risk Table                     Men   Women  1/2 Average Risk   3.4   3.3  Average Risk       5.0   4.4  2 X Average Risk   9.6   7.1  3 X Average Risk  23.4   11.0        Use the calculated Patient Ratio above and the CHD Risk Table to determine the patient's CHD Risk.        ATP III CLASSIFICATION (LDL):  <100     mg/dL   Optimal  100-129  mg/dL   Near or Above                    Optimal  130-159  mg/dL   Borderline  160-189  mg/dL   High  >190     mg/dL   Very High     Imaging / Studies: Dg Abd 2 Views  10/25/2015  CLINICAL DATA:  Small bowel obstruction EXAM: ABDOMEN - 2 VIEW COMPARISON:  10/24/2015; 10/23/2015; CT abdomen pelvis - 10/23/2015 FINDINGS: A small amount of enteric contrast remains within the ascending and transverse colon. Interval removal of enteric tube There is persistent moderate gaseous distention of multiple loops of small bowel with index loop within the left mid abdomen measuring approximately 3.6 cm in diameter and additional loop within the left lower abdominal quadrant measuring approximately 5.3 cm. No pneumoperitoneum, pneumatosis or portal venous gas. Vascular calcifications  overlie the lower pelvis bilaterally. Several phleboliths overlie the right lower pelvis. Otherwise, no definitive abnormal intra-abdominal calcifications given overlying colonic stool burden. Limited visualization of the lower thorax is normal. No acute osseus abnormalities. Mild scoliotic curvature of the lumbar spine, convex to the left with associated moderate severe multilevel lumbar spine DDD. Post dynamic lag in cancellous screw fixation of the left femoral neck and proximal aspect the left femur, incompletely evaluated. Similar findings worrisome for persistent high-grade small bowel obstruction. IMPRESSION: 1. Similar findings worrisome for persistent small bowel obstruction. 2. Interval removal of enteric tube. Electronically Signed   By: Eldridge Abrahams.D.  On: 10/25/2015 08:13   Dg Abd Portable 1v  10/24/2015  CLINICAL DATA:  Small bowel obstruction EXAM: PORTABLE ABDOMEN - 1 VIEW COMPARISON:  CT abdomen pelvis dated 10/23/2015 FINDINGS: Dilated loops of small bowel in the central abdomen, including opacified loops of small bowel in the left mid abdomen. Contrast has not yet reached the colon. Degenerative changes of the lumbar spine. Status post ORIF of the left hip IMPRESSION: Dilated loops of small bowel, as above, compatible with small bowel obstruction. Contrast has not yet reached the colon. Electronically Signed   By: Julian Hy M.D.   On: 10/24/2015 09:07   Dg Addison Bailey G Tube Plc W/fl-no Rad  10/24/2015  CLINICAL DATA:  NASO G TUBE PLACEMENT WITH FLUORO Fluoroscopy was utilized by the requesting physician.  No radiographic interpretation.    Medications / Allergies:  Scheduled Meds: . aspirin  81 mg Oral Daily  . cefTRIAXone (ROCEPHIN)  IV  1 g Intravenous Q24H  . digoxin  0.0625 mg Oral Daily  . metoprolol tartrate  25 mg Oral BID  . pantoprazole (PROTONIX) IV  40 mg Intravenous Q24H   Continuous Infusions: . 0.9 % NaCl with KCl 40 mEq / L 50 mL/hr (10/25/15 1129)  . heparin  1,100 Units/hr (10/26/15 0435)   PRN Meds:.metoprolol, morphine injection, [DISCONTINUED] ondansetron **OR** ondansetron (ZOFRAN) IV  Antibiotics: Anti-infectives    Start     Dose/Rate Route Frequency Ordered Stop   10/25/15 1800  cefTRIAXone (ROCEPHIN) 1 g in dextrose 5 % 50 mL IVPB     1 g 100 mL/hr over 30 Minutes Intravenous Every 24 hours 10/25/15 1117 10/29/15 1759   10/24/15 0100  piperacillin-tazobactam (ZOSYN) IVPB 2.25 g  Status:  Discontinued     2.25 g 100 mL/hr over 30 Minutes Intravenous 3 times per day 10/23/15 1837 10/25/15 1117   10/23/15 1700  piperacillin-tazobactam (ZOSYN) IVPB 3.375 g     3.375 g 100 mL/hr over 30 Minutes Intravenous  Once 10/23/15 1654 10/23/15 1810      Assessment/Plan SBO-no previous surgeries. Passing flatus. Abdomen is soft and non tender.PPI for GERD. AXR 3/13 showed progression of contrast to the right colon.  Await today's films.  CV-new diagnosis of Afib, heparin drip.  Would hold off on oral anticoagulation until SBO has resolved.  Echo reveals right sided heart failure ID-UTI Mental status changes-uti v medication.  Per medicine.    Erby Pian, [facility] Surgery Pager [phone]) For consults and floor pages call [phone]([id number])  10/26/2015 7:56 AM

## 2015-10-26 NOTE — Care Management Important Message (Signed)
Important Message  Patient Details  Name: Jesse Barber MRN: [id number] Date of Birth: 28-Jul-1929   Medicare Important Message Given:  Yes    Alva Broxson Abena 10/26/2015, 11:36 AM

## 2015-10-26 NOTE — Progress Notes (Signed)
ANTICOAGULATION CONSULT NOTE - Initial Consult  Pharmacy Consult for apixaban Indication: atrial fibrillation  No Known Allergies  Patient Measurements: Height: 5\' 6"  (167.6 cm) Weight: 124 lb 9 oz (56.5 kg) IBW/kg (Calculated) : 63.8 Heparin Dosing Weight:   Vital Signs: Temp: 97.6 F (36.4 C) (03/14 1348) Temp Source: Axillary (03/14 1348) BP: 150/82 mmHg (03/14 1348) Pulse Rate: 78 (03/14 1348)  Labs:  Recent Labs  10/23/15 2015  10/23/15 2024 10/24/15 0316  10/25/15 0233  10/25/15 1558 10/26/15 0332 10/26/15 1405  HGB  --   < > 17.3* 14.3  --  13.2  --   --   --   --   HCT  --   --  51.0 41.7  --  40.1  --   --   --   --   PLT  --   --   --  188  --  188  --   --   --   --   HEPARINUNFRC  --   --   --  0.14*  < >  --   < > 0.15* 0.20* 0.27*  CREATININE  --   < > 2.60* 2.70*  --  1.84*  --   --  1.35*  --   TROPONINI 0.06*  --   --  0.06*  --   --   --   --   --   --   < > = values in this interval not displayed.  Estimated Creatinine Clearance: 32.6 mL/min (by C-G formula based on Cr of 1.35).   Medical History: Past Medical History  Diagnosis Date  . Asthma   . Arthritis   . Depression   . Dementia   . Incontinence   . Abnormal EKG 10/25/2015  . Atrial fibrillation with rapid ventricular response (HCC) 10/25/2015  . Elevated troponin 10/25/2015    Medications:  Scheduled:  . amitriptyline  25 mg Oral QHS  . aspirin  81 mg Oral Daily  . cefTRIAXone (ROCEPHIN)  IV  1 g Intravenous Q24H  . darifenacin  7.5 mg Oral Daily  . digoxin  0.0625 mg Oral Daily  . donepezil  5 mg Oral QHS  . metoprolol tartrate  25 mg Oral BID  . pantoprazole (PROTONIX) IV  40 mg Intravenous Q24H  . regadenoson       Infusions:  . sodium chloride 50 mL/hr at 10/26/15 1339    Assessment: 82 yo male with afib will be transitioned from heparin to apixaban.  Patient is >/= 82 yo, weighs 56.5 kg and SCr is 1.35.  Goal of Therapy:   Monitor platelets by anticoagulation  protocol: Yes   Plan:  - d/c heparin now - apixaban 2.5 mg po bid, 1st dose now - monitor for bleeding  Laetitia Schnepf, Tsz-Yin 10/26/2015,5:05 PM

## 2015-10-26 NOTE — Progress Notes (Signed)
Lexiscan MV performed. 1 day study, [place] to read.  Theodore DemarkBarrett, Rhonda, PA-C 10/26/2015 11:59 AM Beeper [phone]

## 2015-10-26 NOTE — Progress Notes (Addendum)
Patient Demographics:    Jesse Barber, is a 82 y.o. male, DOB - [birth year], ZOX:[id number]  Admit date - 10/23/2015   Admitting Physician Bobette Mo, MD  Outpatient Primary MD for the patient is [name], MD  LOS - 3  Summary  This is a pleasant 82 year old [place] gentleman who does not speak English with past medical history of smoking, dementia, COPD, mild depression and osteoarthritis who was admitted to the hospital 3 days ago with chief complaints of nausea vomiting and some abdominal discomfort he was diagnosed with small bowel obstruction, he was also found to have atrial fibrillation, general surgery cardiology were consulted. His small bowel obstruction is now almost completely resolved, he is also due for a stress test per cardiology.  Chief Complaint  Patient presents with  . Abdominal Pain  . Emesis        Subjective:    [place] today has, No headache, No chest pain, No abdominal pain - No Nausea, No new weakness tingling or numbness, No Cough - SOB. Some bedside helped in translation.   Assessment  & Plan :     1.Small bowel obstruction. Discussed with family members and patient, patient has had no previous abdominal surgeries, has been fairly healthy without any weight loss, at this time general surgery on board, Patient pulled NG tube, seen by general surgery, now passing flatness, placed on clears , Pending today's x-ray, we'll defer advancing diet to surgery.  2. Newly diagnosed paroxysmal atrial fibrillation. [place] vasc 2 score of at least 2. Was in RVR, placed on IV/PO digoxin along with low-dose beta blocker and as needed IV Lopressor, stable TSH , stable echogram with an EF of 60%, no wall motion abnormality or diastolic dysfunction, cardiology on board.  Continue heparin drip for now. Per cardiology he may require a stress test prior to discharge as well. Thereafter can be transitioned to a oral anticoagulant.  3. Dehydration, ARF and hypokalemia due to emesis from SBO. Improving with hydration note we do not have a baseline creatinine, he might have some element of CK D stage unknown. Having said that renal function trend has considerably improved.  4. History of ongoing smoking and COPD. At baseline, supportive care only. Counseled to quit smoking.  5. AAA 3.4 cm aneurysm diagnosed on CT scan. Outpatient vascular surgery follow-up and monitoring, have placed him on beta blocker .  6. Mild nonspecific elevation of troponin in non-ACS pattern due to A. fib RVR. Chest pain-free, EKG nonacute, supportive care, place on aspirin and beta blocker, per cardiology will require a stress test in the next few days.  7. UTI - switched from Zosyn to Rocephin x 4 days, monitor cultures.   8. History of dementia. Resume home medications, at risk for Delirium - son explained.    Code Status : Full  Family Communication  : Son over the phone  Disposition Plan  : Stay in stepdown  Consults  :  Gen. surgery, cardiology  Procedures  :   CT scan abdomen and pelvis confirming SBO and bowel dilation  Echogram pending -  - Left ventricle: The cavity size was normal. Systolic function was normal. The estimated ejection fraction was in the range of 55% to 60%.  Wall motion was normal; there were no regional wall  motion abnormalities. Left ventricular diastolic function parameters were normal. - Aortic valve: There was moderate regurgitation. - Mitral valve: There was mild regurgitation. - Left atrium: The atrium was normal in size. - Right ventricle: The cavity size was moderately dilated. Wall thickness was normal. Systolic function was moderately reduced. - Right atrium: The atrium was moderately dilated. - Tricuspid valve: There was moderate  regurgitation. - Pulmonary arteries: Systolic pressure was moderately increased. PA peak pressure: 47 mm Hg (S).  DVT Prophylaxis  :   Heparin  gtt  Lab Results  Component Value Date   PLT 188 10/25/2015    Inpatient Medications  Scheduled Meds: . aspirin  81 mg Oral Daily  . cefTRIAXone (ROCEPHIN)  IV  1 g Intravenous Q24H  . digoxin  0.0625 mg Oral Daily  . metoprolol tartrate  25 mg Oral BID  . pantoprazole (PROTONIX) IV  40 mg Intravenous Q24H  . regadenoson      . regadenoson  0.4 mg Intravenous Once   Continuous Infusions: . 0.9 % NaCl with KCl 40 mEq / L 50 mL/hr (10/26/15 0833)  . heparin 1,100 Units/hr (10/26/15 0435)   PRN Meds:.metoprolol, morphine injection, [DISCONTINUED] ondansetron **OR** ondansetron (ZOFRAN) IV  Antibiotics  :     Anti-infectives    Start     Dose/Rate Route Frequency Ordered Stop   10/25/15 1800  cefTRIAXone (ROCEPHIN) 1 g in dextrose 5 % 50 mL IVPB     1 g 100 mL/hr over 30 Minutes Intravenous Every 24 hours 10/25/15 1117 10/29/15 1759   10/24/15 0100  piperacillin-tazobactam (ZOSYN) IVPB 2.25 g  Status:  Discontinued     2.25 g 100 mL/hr over 30 Minutes Intravenous 3 times per day 10/23/15 1837 10/25/15 1117   10/23/15 1700  piperacillin-tazobactam (ZOSYN) IVPB 3.375 g     3.375 g 100 mL/hr over 30 Minutes Intravenous  Once 10/23/15 1654 10/23/15 1810        Objective:   Filed Vitals:   10/26/15 0400 10/26/15 0410 10/26/15 0416 10/26/15 0737  BP: 130/100  136/90 138/90  Pulse: 99  76 86  Temp:  97.3 F (36.3 C)  97.5 F (36.4 C)  TempSrc:  Axillary  Axillary  Resp: 26  24 14   Height:      Weight:      SpO2: 97%  95% 98%    Wt Readings from Last 3 Encounters:  10/23/15 56.5 kg (124 lb 9 oz)  10/23/15 56.246 kg (124 lb)     Intake/Output Summary (Last 24 hours) at 10/26/15 1023 Last data filed at 10/26/15 0600  Gross per 24 hour  Intake 837.24 ml  Output   1250 ml  Net -412.76 ml     Physical Exam  Awake  Alert, Oriented X 3, No new F.N deficits, Normal affect [place].AT,PERRAL Supple Neck,No JVD, No cervical lymphadenopathy appriciated.  Symmetrical Chest wall movement, Good air movement bilaterally, CTAB RRR,No Gallops,Rubs or new Murmurs, No Parasternal Heave Hypoactive B.Sounds, Abd Soft with mild distension, No tenderness, No organomegaly appriciated, No rebound - guarding or rigidity. No Cyanosis, Clubbing or edema, No new Rash or bruise       Data Review:   Micro Results Recent Results (from the past 240 hour(s))  Blood Culture (routine x 2)     Status: None (Preliminary result)   Collection Time: 10/23/15  4:25 PM  Result Value Ref Range Status   Specimen Description BLOOD RIGHT FOREARM  Final   Special Requests BOTTLES DRAWN AEROBIC AND ANAEROBIC 5CC  Final   Culture NO GROWTH 2 DAYS  Final   Report Status PENDING  Incomplete  Blood Culture (routine x 2)     Status: None (Preliminary result)   Collection Time: 10/23/15  4:26 PM  Result Value Ref Range Status   Specimen Description BLOOD LEFT FOREARM  Final   Special Requests BOTTLES DRAWN AEROBIC ONLY 5CC  Final   Culture NO GROWTH 2 DAYS  Final   Report Status PENDING  Incomplete  Urine culture     Status: None   Collection Time: 10/23/15  9:16 PM  Result Value Ref Range Status   Specimen Description URINE, CLEAN CATCH  Final   Special Requests NONE  Final   Culture MULTIPLE SPECIES PRESENT, SUGGEST RECOLLECTION  Final   Report Status 10/25/2015 FINAL  Final  MRSA PCR Screening     Status: None   Collection Time: 10/24/15  1:00 AM  Result Value Ref Range Status   MRSA by PCR NEGATIVE NEGATIVE Final    Comment:        The GeneXpert MRSA Assay (FDA approved for NASAL specimens only), is one component of a comprehensive MRSA colonization surveillance program. It is not intended to diagnose MRSA infection nor to guide or monitor treatment for MRSA infections.     Radiology Reports Ct Abdomen Pelvis Wo  Contrast  10/23/2015  CLINICAL DATA:  Nausea and vomiting with generalized abdominal pain. EXAM: CT ABDOMEN AND PELVIS WITHOUT CONTRAST TECHNIQUE: Multidetector CT imaging of the abdomen and pelvis was performed following the standard protocol without IV contrast. COMPARISON:  Acute abdomen series from earlier today. FINDINGS: Lower chest: Emphysema noted in the lung bases with bronchial wall thickening and dependent atelectasis. Hepatobiliary: No focal abnormality in the liver on this study without intravenous contrast. No evidence of hepatomegaly. Gallbladder is distended. No intrahepatic or extrahepatic biliary dilation. Pancreas: No focal mass lesion. No dilatation of the main duct. No intraparenchymal cyst. No peripancreatic edema. Spleen: No splenomegaly. No focal mass lesion. Adrenals/Urinary Tract: No adrenal nodule or mass. No renal stones or hydronephrosis. No definite renal mass lesion on this study without intravenous contrast material. No evidence for hydroureter. The urinary bladder appears normal for the degree of distention. Stomach/Bowel: Mild circumferential wall thickening is seen in the distal esophagus and there is a small hiatal hernia. Stomach otherwise unremarkable. Duodenum is normally positioned as is the ligament of Treitz. Proximal jejunal loops are opacified and not substantially dilated. However, mid small bowel is air-filled and dilated up to 4.2 cm in diameter. The distal ileal loops are completely decompressed and the terminal ileum is visible on image 41 series 21. No definite transition zone can be identified. The colon is largely decompressed throughout. No evidence for bowel wall thickening. No pneumatosis. Vascular/Lymphatic: Abdominal aortic atherosclerosis noted with infrarenal abdominal aorta measuring up to 3.4 cm in diameter. There is no gastrohepatic or hepatoduodenal ligament lymphadenopathy. No intraperitoneal or retroperitoneal lymphadenopathy. No pelvic sidewall  lymphadenopathy. Reproductive: The prostate gland and seminal vesicles have normal imaging features. Other: No intraperitoneal free fluid. No evidence for interloop mesenteric fluid Musculoskeletal: Patient is status post ORIF for proximal left femur fracture. Bones are diffusely demineralized Bone windows reveal no worrisome lytic or sclerotic osseous lesions. IMPRESSION: 1. Dilated small bowel up to 4.2 cm compatible with small bowel obstruction. The point of obstruction is probably in the mid to distal small bowel although a discrete transition zone cannot be  identified on the current exam. There is no associated pneumatosis or bowel wall thickening. No interloop mesenteric fluid. 2. Circumferential wall thickening in the distal esophagus associated with small hiatal hernia. Changes in the esophagus may be related and reflux. 3. 3.4 cm abdominal aortic aneurysm. Recommend followup by ultrasound in 3 years. This recommendation follows ACR consensus guidelines: White Paper of the ACR Incidental Findings Committee II on Vascular Findings. [place] Coll Radiol 2013; 10:789-794 Electronically Signed   By: Kennith Center M.D.   On: 10/23/2015 18:57   Dg Chest Port 1 View  10/23/2015  CLINICAL DATA:  Sepsis. EXAM: PORTABLE CHEST 1 VIEW COMPARISON:  Chest and abdominal radiographs earlier this day. Included lung bases from CT abdomen. FINDINGS: Lower lung volumes from prior exam. Developing ill-defined and linear bibasilar opacities. No pulmonary edema. No confluent airspace disease. No pleural effusion or pneumothorax. Dilated bowel loops in the right upper quadrant. IMPRESSION: Developing ill-defined linear bibasilar opacities, likely atelectasis. Given bowel obstruction, aspiration could have a similar appearance. Electronically Signed   By: Rubye Oaks M.D.   On: 10/23/2015 23:59   Dg Abd 2 Views  10/25/2015  CLINICAL DATA:  Small bowel obstruction EXAM: ABDOMEN - 2 VIEW COMPARISON:  10/24/2015; 10/23/2015; CT  abdomen pelvis - 10/23/2015 FINDINGS: A small amount of enteric contrast remains within the ascending and transverse colon. Interval removal of enteric tube There is persistent moderate gaseous distention of multiple loops of small bowel with index loop within the left mid abdomen measuring approximately 3.6 cm in diameter and additional loop within the left lower abdominal quadrant measuring approximately 5.3 cm. No pneumoperitoneum, pneumatosis or portal venous gas. Vascular calcifications overlie the lower pelvis bilaterally. Several phleboliths overlie the right lower pelvis. Otherwise, no definitive abnormal intra-abdominal calcifications given overlying colonic stool burden. Limited visualization of the lower thorax is normal. No acute osseus abnormalities. Mild scoliotic curvature of the lumbar spine, convex to the left with associated moderate severe multilevel lumbar spine DDD. Post dynamic lag in cancellous screw fixation of the left femoral neck and proximal aspect the left femur, incompletely evaluated. Similar findings worrisome for persistent high-grade small bowel obstruction. IMPRESSION: 1. Similar findings worrisome for persistent small bowel obstruction. 2. Interval removal of enteric tube. Electronically Signed   By: Simonne Come M.D.   On: 10/25/2015 08:13   Dg Abd Acute W/chest  10/23/2015  CLINICAL DATA:  82 year old male with history of abdominal pain. EXAM: DG ABDOMEN ACUTE W/ 1V CHEST COMPARISON:  No priors. FINDINGS: Several small dense nodules in the right hemithorax are most compatible with calcified granulomas. Bilateral nipple shadows incidentally noted. No acute consolidative airspace disease. No pleural effusions. No evidence of pulmonary edema. No pneumothorax. Heart size is normal. Upper mediastinal contours are within normal limits. Atherosclerosis in the thoracic aorta. Numerous dilated loops of small bowel measuring up to 6.3 cm in diameter. There is a small amount of colonic  and rectal gas and stool noted. No frank pneumoperitoneum confidently identified. Orthopedic fixation hardware in the left hip incompletely visualize. Numerous vascular calcifications are noted. IMPRESSION: 1. Appearance of the abdomen is compatible with small bowel obstruction, as above. Given the presence of gas and stool in the colon, obstruction may be early, or could be partial. 2. No pneumoperitoneum. 3. No radiographic evidence of acute cardiopulmonary disease. 4. Atherosclerosis. Electronically Signed   By: Trudie Reed M.D.   On: 10/23/2015 14:39   Dg Abd Portable 1v  10/24/2015  CLINICAL DATA:  Small bowel obstruction  EXAM: PORTABLE ABDOMEN - 1 VIEW COMPARISON:  CT abdomen pelvis dated 10/23/2015 FINDINGS: Dilated loops of small bowel in the central abdomen, including opacified loops of small bowel in the left mid abdomen. Contrast has not yet reached the colon. Degenerative changes of the lumbar spine. Status post ORIF of the left hip IMPRESSION: Dilated loops of small bowel, as above, compatible with small bowel obstruction. Contrast has not yet reached the colon. Electronically Signed   By: Charline Bills M.D.   On: 10/24/2015 09:07   Dg Vangie Bicker G Tube Plc W/fl-no Rad  10/24/2015  CLINICAL DATA:  NASO G TUBE PLACEMENT WITH FLUORO Fluoroscopy was utilized by the requesting physician.  No radiographic interpretation.     CBC  Recent Labs Lab 10/23/15 1421 10/23/15 1628 10/23/15 2024 10/24/15 0316 10/25/15 0233  WBC 20.2* 19.6*  --  12.6* 6.5  HGB 17.7 17.7* 17.3* 14.3 13.2  HCT 50.4 51.6 51.0 41.7 40.1  PLT  --  228  --  188 188  MCV 91.6 91.3  --  90.7 92.4  MCH 32.2* 31.3  --  31.1 30.4  MCHC 35.2 34.3  --  34.3 32.9  RDW  --  14.6  --  14.6 14.3  LYMPHSABS  --   --   --  1.0 0.8  MONOABS  --   --   --  0.9 0.6  EOSABS  --   --   --  0.0 0.1  BASOSABS  --   --   --  0.0 0.0    Chemistries   Recent Labs Lab 10/23/15 1628 10/23/15 2015 10/23/15 2024 10/24/15 0316  10/24/15 1207 10/25/15 0233 10/26/15 0332  NA 133*  --  133* 133*  --  138 141  K 3.8  --  3.4* 3.2* 3.7 3.7 4.9  CL 85*  --  95* 93*  --  102 111  CO2 25  --   --  25  --  27 18*  GLUCOSE 149*  --  119* 156*  --  96 86  BUN 86*  --  69* 74*  --  42* 32*  CREATININE 3.39*  --  2.60* 2.70*  --  1.84* 1.35*  CALCIUM 9.7  --   --  8.1*  --  7.9* 8.1*  MG  --  1.9  --   --   --  1.9  --   AST 25  --   --  17  --  13* 23  ALT 12*  --   --  11*  --  9* 8*  ALKPHOS 75  --   --  54  --  43 49  BILITOT 2.2*  --   --  1.6*  --  1.9* 2.0*   ------------------------------------------------------------------------------------------------------------------  Recent Labs  10/26/15 0332  CHOL 151  HDL 32*  LDLCALC 95  TRIG 161  CHOLHDL 4.7    No results found for: HGBA1C ------------------------------------------------------------------------------------------------------------------  Recent Labs  10/24/15 0730  TSH 2.622   ------------------------------------------------------------------------------------------------------------------ No results for input(s): VITAMINB12, FOLATE, FERRITIN, TIBC, IRON, RETICCTPCT in the last 72 hours.  Coagulation profile No results for input(s): INR, PROTIME in the last 168 hours.  No results for input(s): DDIMER in the last 72 hours.  Cardiac Enzymes  Recent Labs Lab 10/23/15 2015 10/24/15 0316  TROPONINI 0.06* 0.06*   ------------------------------------------------------------------------------------------------------------------ No results found for: BNP  Time Spent in minutes  35   Susa Raring K M.D on 10/26/2015 at 10:23 AM  Between 7am  to 7pm - Pager - [phone]  After 7pm go to [email or web address] - password [facility]  [facility] -  Office  [phone]

## 2015-10-27 LAB — MAGNESIUM: MAGNESIUM: 1.7 mg/dL (ref 1.7–2.4)

## 2015-10-27 LAB — COMPREHENSIVE METABOLIC PANEL
ALK PHOS: 51 U/L (ref 38–126)
ALT: 18 U/L (ref 17–63)
AST: 29 U/L (ref 15–41)
Albumin: 2.9 g/dL — ABNORMAL LOW (ref 3.5–5.0)
Anion gap: 11 (ref 5–15)
BILIRUBIN TOTAL: 1.1 mg/dL (ref 0.3–1.2)
BUN: 31 mg/dL — AB (ref 6–20)
CALCIUM: 8.6 mg/dL — AB (ref 8.9–10.3)
CO2: 22 mmol/L (ref 22–32)
CREATININE: 1.39 mg/dL — AB (ref 0.61–1.24)
Chloride: 111 mmol/L (ref 101–111)
GFR, EST AFRICAN AMERICAN: 52 mL/min — AB (ref >60)
GFR, EST NON AFRICAN AMERICAN: 45 mL/min — AB (ref >60)
Glucose, Bld: 101 mg/dL — ABNORMAL HIGH (ref 65–99)
Potassium: 4.4 mmol/L (ref 3.5–5.1)
Sodium: 144 mmol/L (ref 135–145)
TOTAL PROTEIN: 6 g/dL — AB (ref 6.5–8.1)

## 2015-10-27 MED ORDER — PANTOPRAZOLE SODIUM 40 MG PO TBEC
40.0000 mg | DELAYED_RELEASE_TABLET | Freq: Every day | ORAL | Status: DC
  Administered 2015-10-27: 40 mg via ORAL
  Filled 2015-10-27: qty 1

## 2015-10-27 MED ORDER — SODIUM CHLORIDE 0.9 % IV SOLN
INTRAVENOUS | Status: DC
Start: 2015-10-27 — End: 2015-10-28

## 2015-10-27 NOTE — Progress Notes (Signed)
Patient Demographics:    Jesse Barber, is a 82 y.o. male, DOB - 1929-04-13, ZOX:[id number]  Admit date - 10/23/2015   Admitting Physician Bobette Mo, MD  Outpatient Primary MD for the patient is [name], MD  LOS - 4  Summary  This is a pleasant 82 year old [place] gentleman who does not speak English with past medical history of smoking, dementia, COPD, mild depression and osteoarthritis who was admitted c nausea vomiting and some abdominal discomfort he was diagnosed with small bowel obstruction, he was also found to have atrial fibrillation, general surgery cardiology were consulted.   Chief Complaint  Patient presents with  . Abdominal Pain  . Emesis        Subjective:   Fair no issues tol sips No vomit Some flatus, no stool yet Son bedside helped in translation.   Assessment  & Plan :     1.Small bowel obstruction. Discussed with family members and patient, patient has had no previous abdominal surgeries, has been fairly healthy without any weight loss, at this time general surgery on board, Patient pulled NG tube, seen by general surgery, now passing flatus.  Advancing diet  2. Newly diagnosed paroxysmal atrial fibrillation. [place] vasc 2 score =3. Was in RVR, placed on IV/PO digoxin along with low-dose beta blocker and as needed IV Lopressor, stable TSH , stable echogram with an EF of 60%, no wall motion abnormality or diastolic dysfunction, cardiology on board. Changed to PO metoprolol, digoxin d/c, start elliquis 2.5 bid Close OP f/u 3-4 wk for ? DCCV  3. Dehydration, ARF and hypokalemia due to emesis from SBO. Improving with hydration note we do not have a baseline creatinine, he might have some element of CK D stage unknown.  4. History of ongoing smoking and COPD. At  baseline, supportive care only. Counseled to quit smoking.  5. AAA 3.4 cm aneurysm diagnosed on CT scan. Outpatient vascular surgery follow-up and monitoring, have placed him on beta blocker .  6. Mild nonspecific elevation of troponin in non-ACS pattern due to A. fib RVR. Chest pain-free, EKG nonacute, supportive care, place on aspirin and beta blocker, per cardiology will require a stress test in the next few days.  7. UTI - switched from Zosyn to Rocephin.  as cultures are neg, d/c Abx 3/15  8. History of dementia. Resume home medications, at risk for Delirium - son explained.    Code Status : Full  Family Communication  : Son  In person  Transfer to tele  CT scan abdomen and pelvis confirming SBO and bowel dilation  Echogram   - Left ventricle: The cavity size was normal. Systolic function was normal. The estimated ejection fraction was in the range of 55% to 60%. Wall motion was normal; there were no regional wall  motion abnormalities. Left ventricular diastolic function parameters were normal. - Aortic valve: There was moderate regurgitation. - Mitral valve: There was mild regurgitation. - Left atrium: The atrium was normal in size. - Right ventricle: The cavity size was moderately dilated. Wall thickness was normal. Systolic function was moderately reduced. - Right atrium: The atrium was moderately dilated. - Tricuspid valve: There was moderate regurgitation. - Pulmonary arteries: Systolic pressure was moderately increased. PA peak pressure: 47  mm Hg (S).  DVT Prophylaxis  :   Heparin  gtt  Lab Results  Component Value Date   PLT 188 10/25/2015    Inpatient Medications  Scheduled Meds: . amitriptyline  25 mg Oral QHS  . apixaban  2.5 mg Oral BID  . aspirin  81 mg Oral Daily  . cefTRIAXone (ROCEPHIN)  IV  1 g Intravenous Q24H  . darifenacin  7.5 mg Oral Daily  . donepezil  5 mg Oral QHS  . metoprolol tartrate  25 mg Oral BID  . pantoprazole (PROTONIX) IV  40 mg  Intravenous Q24H   Continuous Infusions: . sodium chloride     PRN Meds:.metoprolol, morphine injection, [DISCONTINUED] ondansetron **OR** ondansetron (ZOFRAN) IV  Antibiotics  :     Anti-infectives    Start     Dose/Rate Route Frequency Ordered Stop   10/25/15 1800  cefTRIAXone (ROCEPHIN) 1 g in dextrose 5 % 50 mL IVPB     1 g 100 mL/hr over 30 Minutes Intravenous Every 24 hours 10/25/15 1117 10/29/15 1759   10/24/15 0100  piperacillin-tazobactam (ZOSYN) IVPB 2.25 g  Status:  Discontinued     2.25 g 100 mL/hr over 30 Minutes Intravenous 3 times per day 10/23/15 1837 10/25/15 1117   10/23/15 1700  piperacillin-tazobactam (ZOSYN) IVPB 3.375 g     3.375 g 100 mL/hr over 30 Minutes Intravenous  Once 10/23/15 1654 10/23/15 1810        Objective:   Filed Vitals:   10/27/15 0439 10/27/15 0800 10/27/15 0955 10/27/15 0957  BP: 147/96 158/77  158/77  Pulse: 81 78 76 72  Temp: 98.3 F (36.8 C) 98.2 F (36.8 C)    TempSrc: Oral Oral    Resp: 20 21    Height:      Weight:      SpO2: 98% 92%      Wt Readings from Last 3 Encounters:  10/23/15 56.5 kg (124 lb 9 oz)  10/23/15 56.246 kg (124 lb)     Intake/Output Summary (Last 24 hours) at 10/27/15 1248 Last data filed at 10/27/15 1000  Gross per 24 hour  Intake 422.33 ml  Output    400 ml  Net  22.33 ml     Physical Exam  Awake Alert, Oriented X 3, No new F.N deficits, Normal affect [place].AT,PERRAL Supple Neck,No JVD, No cervical lymphadenopathy appriciated.  Symmetrical Chest wall movement, Good air movement bilaterally, CTAB RRR,No Gallops,Rubs or new Murmurs, No Parasternal Heave Hypoactive B.Sounds, Abd Soft with mild distension, No tenderness, No organomegaly appriciated, No rebound - guarding or rigidity.    Data Review:   Micro Results Recent Results (from the past 240 hour(s))  Blood Culture (routine x 2)     Status: None (Preliminary result)   Collection Time: 10/23/15  4:25 PM  Result Value Ref Range  Status   Specimen Description BLOOD RIGHT FOREARM  Final   Special Requests BOTTLES DRAWN AEROBIC AND ANAEROBIC 5CC  Final   Culture NO GROWTH 3 DAYS  Final   Report Status PENDING  Incomplete  Blood Culture (routine x 2)     Status: None (Preliminary result)   Collection Time: 10/23/15  4:26 PM  Result Value Ref Range Status   Specimen Description BLOOD LEFT FOREARM  Final   Special Requests BOTTLES DRAWN AEROBIC ONLY 5CC  Final   Culture NO GROWTH 3 DAYS  Final   Report Status PENDING  Incomplete  Urine culture     Status: None  Collection Time: 10/23/15  9:16 PM  Result Value Ref Range Status   Specimen Description URINE, CLEAN CATCH  Final   Special Requests NONE  Final   Culture MULTIPLE SPECIES PRESENT, SUGGEST RECOLLECTION  Final   Report Status 10/25/2015 FINAL  Final  MRSA PCR Screening     Status: None   Collection Time: 10/24/15  1:00 AM  Result Value Ref Range Status   MRSA by PCR NEGATIVE NEGATIVE Final    Comment:        The GeneXpert MRSA Assay (FDA approved for NASAL specimens only), is one component of a comprehensive MRSA colonization surveillance program. It is not intended to diagnose MRSA infection nor to guide or monitor treatment for MRSA infections.     Radiology Reports Ct Abdomen Pelvis Wo Contrast  10/23/2015  CLINICAL DATA:  Nausea and vomiting with generalized abdominal pain. EXAM: CT ABDOMEN AND PELVIS WITHOUT CONTRAST TECHNIQUE: Multidetector CT imaging of the abdomen and pelvis was performed following the standard protocol without IV contrast. COMPARISON:  Acute abdomen series from earlier today. FINDINGS: Lower chest: Emphysema noted in the lung bases with bronchial wall thickening and dependent atelectasis. Hepatobiliary: No focal abnormality in the liver on this study without intravenous contrast. No evidence of hepatomegaly. Gallbladder is distended. No intrahepatic or extrahepatic biliary dilation. Pancreas: No focal mass lesion. No  dilatation of the main duct. No intraparenchymal cyst. No peripancreatic edema. Spleen: No splenomegaly. No focal mass lesion. Adrenals/Urinary Tract: No adrenal nodule or mass. No renal stones or hydronephrosis. No definite renal mass lesion on this study without intravenous contrast material. No evidence for hydroureter. The urinary bladder appears normal for the degree of distention. Stomach/Bowel: Mild circumferential wall thickening is seen in the distal esophagus and there is a small hiatal hernia. Stomach otherwise unremarkable. Duodenum is normally positioned as is the ligament of Treitz. Proximal jejunal loops are opacified and not substantially dilated. However, mid small bowel is air-filled and dilated up to 4.2 cm in diameter. The distal ileal loops are completely decompressed and the terminal ileum is visible on image 41 series 21. No definite transition zone can be identified. The colon is largely decompressed throughout. No evidence for bowel wall thickening. No pneumatosis. Vascular/Lymphatic: Abdominal aortic atherosclerosis noted with infrarenal abdominal aorta measuring up to 3.4 cm in diameter. There is no gastrohepatic or hepatoduodenal ligament lymphadenopathy. No intraperitoneal or retroperitoneal lymphadenopathy. No pelvic sidewall lymphadenopathy. Reproductive: The prostate gland and seminal vesicles have normal imaging features. Other: No intraperitoneal free fluid. No evidence for interloop mesenteric fluid Musculoskeletal: Patient is status post ORIF for proximal left femur fracture. Bones are diffusely demineralized Bone windows reveal no worrisome lytic or sclerotic osseous lesions. IMPRESSION: 1. Dilated small bowel up to 4.2 cm compatible with small bowel obstruction. The point of obstruction is probably in the mid to distal small bowel although a discrete transition zone cannot be identified on the current exam. There is no associated pneumatosis or bowel wall thickening. No  interloop mesenteric fluid. 2. Circumferential wall thickening in the distal esophagus associated with small hiatal hernia. Changes in the esophagus may be related and reflux. 3. 3.4 cm abdominal aortic aneurysm. Recommend followup by ultrasound in 3 years. This recommendation follows ACR consensus guidelines: White Paper of the ACR Incidental Findings Committee II on Vascular Findings. [place] Coll Radiol 2013; 10:789-794 Electronically Signed   By: Kennith Center M.D.   On: 10/23/2015 18:57   Nm Myocar Multi W/spect W/wall Motion / Ef  10/26/2015  CLINICAL DATA:  82 year old with chest pain. Pre operative evaluation. EXAM: MYOCARDIAL IMAGING WITH SPECT (REST AND PHARMACOLOGIC-STRESS) GATED LEFT VENTRICULAR WALL MOTION STUDY LEFT VENTRICULAR EJECTION FRACTION TECHNIQUE: Standard myocardial SPECT imaging was performed after resting intravenous injection of 10 mCi [id number] sestamibi. Subsequently, intravenous infusion of Lexiscan was performed under the supervision of the Cardiology staff. At peak effect of the drug, 30 mCi Tc-5218m sestamibi was injected intravenously and standard myocardial SPECT imaging was performed. Quantitative gated imaging was also performed to evaluate left ventricular wall motion, and estimate left ventricular ejection fraction. COMPARISON:  None. FINDINGS: Perfusion: Large fixed defect involving the inferior wall. Fixed defect involving the inferoseptal wall. Question a small amount of reversibility along the apex. This finding is equivocal. No other areas are concerning for reversibility or ischemia. Wall Motion: Hypokinesia involving the septal wall and inferoseptal wall. Left Ventricular Ejection Fraction: 48 % End diastolic volume 80 ml End systolic volume 41 ml IMPRESSION: 1. Large fixed defect involving the inferior wall and inferoseptal wall. Findings are suggestive for an infarct. 2. Equivocal reversibility at the apex. Cannot exclude a small area of ischemia at this location. 3. Left  ventricular ejection fraction is 48%. Hypokinesia along the septal wall and inferoseptal wall. 4. Intermediate-risk stress test findings*. *2012 Appropriate Use Criteria for Coronary Revascularization Focused Update: J Am Coll Cardiol. 2012;59(9):857-881. http://content.dementiazones.comonlinejacc.org/article.aspx?articleid=1201161 Electronically Signed   By: Richarda OverlieAdam  Henn M.D.   On: 10/26/2015 14:37   Dg Chest Port 1 View  10/23/2015  CLINICAL DATA:  Sepsis. EXAM: PORTABLE CHEST 1 VIEW COMPARISON:  Chest and abdominal radiographs earlier this day. Included lung bases from CT abdomen. FINDINGS: Lower lung volumes from prior exam. Developing ill-defined and linear bibasilar opacities. No pulmonary edema. No confluent airspace disease. No pleural effusion or pneumothorax. Dilated bowel loops in the right upper quadrant. IMPRESSION: Developing ill-defined linear bibasilar opacities, likely atelectasis. Given bowel obstruction, aspiration could have a similar appearance. Electronically Signed   By: Rubye OaksMelanie  Ehinger M.D.   On: 10/23/2015 23:59   Dg Abd 2 Views  10/26/2015  CLINICAL DATA:  Followup small bowel obstruction. EXAM: ABDOMEN - 2 VIEW COMPARISON:  10/25/2015 FINDINGS: Improved bowel gas pattern with decompression of the dilated small bowel loops. Progression of oral contrast into the distal colon and rectum. No free air. The lung bases are clear. IMPRESSION: Resolution of small bowel obstruction. Electronically Signed   By: Rudie MeyerP.  Gallerani M.D.   On: 10/26/2015 13:32   Dg Abd 2 Views  10/25/2015  CLINICAL DATA:  Small bowel obstruction EXAM: ABDOMEN - 2 VIEW COMPARISON:  10/24/2015; 10/23/2015; CT abdomen pelvis - 10/23/2015 FINDINGS: A small amount of enteric contrast remains within the ascending and transverse colon. Interval removal of enteric tube There is persistent moderate gaseous distention of multiple loops of small bowel with index loop within the left mid abdomen measuring approximately 3.6 cm in diameter and  additional loop within the left lower abdominal quadrant measuring approximately 5.3 cm. No pneumoperitoneum, pneumatosis or portal venous gas. Vascular calcifications overlie the lower pelvis bilaterally. Several phleboliths overlie the right lower pelvis. Otherwise, no definitive abnormal intra-abdominal calcifications given overlying colonic stool burden. Limited visualization of the lower thorax is normal. No acute osseus abnormalities. Mild scoliotic curvature of the lumbar spine, convex to the left with associated moderate severe multilevel lumbar spine DDD. Post dynamic lag in cancellous screw fixation of the left femoral neck and proximal aspect the left femur, incompletely evaluated. Similar findings worrisome for persistent high-grade small bowel obstruction. IMPRESSION:  1. Similar findings worrisome for persistent small bowel obstruction. 2. Interval removal of enteric tube. Electronically Signed   By: Simonne Come M.D.   On: 10/25/2015 08:13   Dg Abd Acute W/chest  10/23/2015  CLINICAL DATA:  82 year old male with history of abdominal pain. EXAM: DG ABDOMEN ACUTE W/ 1V CHEST COMPARISON:  No priors. FINDINGS: Several small dense nodules in the right hemithorax are most compatible with calcified granulomas. Bilateral nipple shadows incidentally noted. No acute consolidative airspace disease. No pleural effusions. No evidence of pulmonary edema. No pneumothorax. Heart size is normal. Upper mediastinal contours are within normal limits. Atherosclerosis in the thoracic aorta. Numerous dilated loops of small bowel measuring up to 6.3 cm in diameter. There is a small amount of colonic and rectal gas and stool noted. No frank pneumoperitoneum confidently identified. Orthopedic fixation hardware in the left hip incompletely visualize. Numerous vascular calcifications are noted. IMPRESSION: 1. Appearance of the abdomen is compatible with small bowel obstruction, as above. Given the presence of gas and stool in  the colon, obstruction may be early, or could be partial. 2. No pneumoperitoneum. 3. No radiographic evidence of acute cardiopulmonary disease. 4. Atherosclerosis. Electronically Signed   By: Trudie Reed M.D.   On: 10/23/2015 14:39   Dg Abd Portable 1v  10/24/2015  CLINICAL DATA:  Small bowel obstruction EXAM: PORTABLE ABDOMEN - 1 VIEW COMPARISON:  CT abdomen pelvis dated 10/23/2015 FINDINGS: Dilated loops of small bowel in the central abdomen, including opacified loops of small bowel in the left mid abdomen. Contrast has not yet reached the colon. Degenerative changes of the lumbar spine. Status post ORIF of the left hip IMPRESSION: Dilated loops of small bowel, as above, compatible with small bowel obstruction. Contrast has not yet reached the colon. Electronically Signed   By: Charline Bills M.D.   On: 10/24/2015 09:07   Dg Vangie Bicker G Tube Plc W/fl-no Rad  10/24/2015  CLINICAL DATA:  NASO G TUBE PLACEMENT WITH FLUORO Fluoroscopy was utilized by the requesting physician.  No radiographic interpretation.     CBC  Recent Labs Lab 10/23/15 1421 10/23/15 1628 10/23/15 2024 10/24/15 0316 10/25/15 0233  WBC 20.2* 19.6*  --  12.6* 6.5  HGB 17.7 17.7* 17.3* 14.3 13.2  HCT 50.4 51.6 51.0 41.7 40.1  PLT  --  228  --  188 188  MCV 91.6 91.3  --  90.7 92.4  MCH 32.2* 31.3  --  31.1 30.4  MCHC 35.2 34.3  --  34.3 32.9  RDW  --  14.6  --  14.6 14.3  LYMPHSABS  --   --   --  1.0 0.8  MONOABS  --   --   --  0.9 0.6  EOSABS  --   --   --  0.0 0.1  BASOSABS  --   --   --  0.0 0.0    Chemistries   Recent Labs Lab 10/23/15 1628 10/23/15 2015 10/23/15 2024 10/24/15 0316 10/24/15 1207 10/25/15 0233 10/26/15 0332 10/27/15 0235  NA 133*  --  133* 133*  --  138 141 144  K 3.8  --  3.4* 3.2* 3.7 3.7 4.9 4.4  CL 85*  --  95* 93*  --  102 111 111  CO2 25  --   --  25  --  27 18* 22  GLUCOSE 149*  --  119* 156*  --  96 86 101*  BUN 86*  --  69* 74*  --  42*  32* 31*  CREATININE 3.39*  --   2.60* 2.70*  --  1.84* 1.35* 1.39*  CALCIUM 9.7  --   --  8.1*  --  7.9* 8.1* 8.6*  MG  --  1.9  --   --   --  1.9  --  1.7  AST 25  --   --  17  --  13* 23 29  ALT 12*  --   --  11*  --  9* 8* 18  ALKPHOS 75  --   --  54  --  43 49 51  BILITOT 2.2*  --   --  1.6*  --  1.9* 2.0* 1.1   ------------------------------------------------------------------------------------------------------------------  Recent Labs  10/26/15 0332  CHOL 151  HDL 32*  LDLCALC 95  TRIG 960  CHOLHDL 4.7    No results found for: HGBA1C ------------------------------------------------------------------------------------------------------------------ No results for input(s): TSH, T4TOTAL, T3FREE, THYROIDAB in the last 72 hours.  Invalid input(s): FREET3 ------------------------------------------------------------------------------------------------------------------ No results for input(s): VITAMINB12, FOLATE, FERRITIN, TIBC, IRON, RETICCTPCT in the last 72 hours.  Coagulation profile No results for input(s): INR, PROTIME in the last 168 hours.  No results for input(s): DDIMER in the last 72 hours.  Cardiac Enzymes  Recent Labs Lab 10/23/15 2015 10/24/15 0316  TROPONINI 0.06* 0.06*   ------------------------------------------------------------------------------------------------------------------ No results found for: BNP  Time Spent in minutes  25  Pleas Koch, MD [facility] [phone]

## 2015-10-27 NOTE — Discharge Instructions (Signed)

## 2015-10-27 NOTE — Progress Notes (Signed)
Patient Name: Jesse Barber Date of Encounter: 10/27/2015  Principal Problem:   SBO (small bowel obstruction) (HCC) Active Problems:   Dementia   Language barrier to communication   COPD (chronic obstructive pulmonary disease) (HCC)   AKI (acute kidney injury) (HCC)   Nausea and vomiting in adult   Tobacco abuse disorder   AAA (abdominal aortic aneurysm) without rupture (HCC)   Abnormal EKG   Atrial fibrillation with rapid ventricular response (HCC)   Elevated troponin   Chronic cor pulmonale (HCC)   Length of Stay: 4  SUBJECTIVE   Relaxed, calm, no discomfort.  CURRENT MEDS . amitriptyline  25 mg Oral QHS  . apixaban  2.5 mg Oral BID  . aspirin  81 mg Oral Daily  . cefTRIAXone (ROCEPHIN)  IV  1 g Intravenous Q24H  . darifenacin  7.5 mg Oral Daily  . digoxin  0.0625 mg Oral Daily  . donepezil  5 mg Oral QHS  . metoprolol tartrate  25 mg Oral BID  . pantoprazole (PROTONIX) IV  40 mg Intravenous Q24H    OBJECTIVE   Intake/Output Summary (Last 24 hours) at 10/27/15 1153 Last data filed at 10/27/15 1000  Gross per 24 hour  Intake 422.33 ml  Output    400 ml  Net  22.33 ml   Filed Weights   10/23/15 1639 10/23/15 2315  Weight: 54.522 kg (120 lb 3.2 oz) 56.5 kg (124 lb 9 oz)    PHYSICAL EXAM Filed Vitals:   10/27/15 0439 10/27/15 0800 10/27/15 0955 10/27/15 0957  BP: 147/96 158/77  158/77  Pulse: 81 78 76 72  Temp: 98.3 F (36.8 C) 98.2 F (36.8 C)    TempSrc: Oral Oral    Resp: 20 21    Height:      Weight:      SpO2: 98% 92%     General: Alert, oriented x3, no distress Head: no evidence of trauma, PERRL, EOMI, no exophtalmos or lid lag, no myxedema, no xanthelasma; normal ears, nose and oropharynx Neck: normal jugular venous pulsations and no hepatojugular reflux; brisk carotid pulses without delay and no carotid bruits Chest: clear to auscultation, no signs of consolidation by percussion or palpation, normal fremitus, symmetrical and full respiratory  excursions Cardiovascular: normal position and quality of the apical impulse, irregular rhythm, normal first and second heart sounds, no rubs or gallops, no murmur Abdomen: no tenderness or distention, no masses by palpation, no abnormal pulsatility or arterial bruits, normal bowel sounds, no hepatosplenomegaly Extremities: no clubbing, cyanosis or edema Neurological: grossly nonfocal  LABS  CBC  Recent Labs  10/25/15 0233  WBC 6.5  NEUTROABS 5.0  HGB 13.2  HCT 40.1  MCV 92.4  PLT 188   Basic Metabolic Panel  Recent Labs  10/25/15 0233 10/26/15 0332 10/27/15 0235  NA 138 141 144  K 3.7 4.9 4.4  CL 102 111 111  CO2 27 18* 22  GLUCOSE 96 86 101*  BUN 42* 32* 31*  CREATININE 1.84* 1.35* 1.39*  CALCIUM 7.9* 8.1* 8.6*  MG 1.9  --  1.7   Liver Function Tests  Recent Labs  10/26/15 0332 10/27/15 0235  AST 23 29  ALT 8* 18  ALKPHOS 49 51  BILITOT 2.0* 1.1  PROT 5.6* 6.0*  ALBUMIN 2.9* 2.9*   Fasting Lipid Panel  Recent Labs  10/26/15 0332  CHOL 151  HDL 32*  LDLCALC 95  TRIG 161  CHOLHDL 4.7    Radiology Studies Imaging results have been reviewed  and Nm Myocar Multi W/spect W/wall Motion / Ef  10/26/2015  CLINICAL DATA:  82 year old with chest pain. Pre operative evaluation. EXAM: MYOCARDIAL IMAGING WITH SPECT (REST AND PHARMACOLOGIC-STRESS) GATED LEFT VENTRICULAR WALL MOTION STUDY LEFT VENTRICULAR EJECTION FRACTION TECHNIQUE: Standard myocardial SPECT imaging was performed after resting intravenous injection of 10 mCi Tc-6450m sestamibi. Subsequently, intravenous infusion of Lexiscan was performed under the supervision of the Cardiology staff. At peak effect of the drug, 30 mCi Tc-2150m sestamibi was injected intravenously and standard myocardial SPECT imaging was performed. Quantitative gated imaging was also performed to evaluate left ventricular wall motion, and estimate left ventricular ejection fraction. COMPARISON:  None. FINDINGS: Perfusion: Large fixed  defect involving the inferior wall. Fixed defect involving the inferoseptal wall. Question a small amount of reversibility along the apex. This finding is equivocal. No other areas are concerning for reversibility or ischemia. Wall Motion: Hypokinesia involving the septal wall and inferoseptal wall. Left Ventricular Ejection Fraction: 48 % End diastolic volume 80 ml End systolic volume 41 ml IMPRESSION: 1. Large fixed defect involving the inferior wall and inferoseptal wall. Findings are suggestive for an infarct. 2. Equivocal reversibility at the apex. Cannot exclude a small area of ischemia at this location. 3. Left ventricular ejection fraction is 48%. Hypokinesia along the septal wall and inferoseptal wall. 4. Intermediate-risk stress test findings*. *2012 Appropriate Use Criteria for Coronary Revascularization Focused Update: J Am Coll Cardiol. 2012;59(9):857-881. http://content.dementiazones.comonlinejacc.org/article.aspx?articleid=1201161 Electronically Signed   By: Richarda OverlieAdam  Henn M.D.   On: 10/26/2015 14:37   Dg Abd 2 Views  10/26/2015  CLINICAL DATA:  Followup small bowel obstruction. EXAM: ABDOMEN - 2 VIEW COMPARISON:  10/25/2015 FINDINGS: Improved bowel gas pattern with decompression of the dilated small bowel loops. Progression of oral contrast into the distal colon and rectum. No free air. The lung bases are clear. IMPRESSION: Resolution of small bowel obstruction. Electronically Signed   By: Rudie MeyerP.  Gallerani M.D.   On: 10/26/2015 13:32    TELE AFib with well controlled rate, 60-80s  ASSESSMENT AND PLAN  1. New onset atrial fibrillation of unknown duration.  - Patient is asymptomatic with this. His rate is well controlled on BB therapy.  -This patients CHA2DS2-VASc Score and unadjusted Ischemic Stroke Rate (% per year) is equal to 3.2 % stroke rate/year from a score of 3 (age>75 and vascular disease).  - Continue Eliquis and metoprolol - Continue metoprolol for rate control. I think he no longer needs  digoxin..   2. Elevated Troponin:  - likely demand ischemia  3. AAA: incidental finding on CT of abdomen and pelvis. 3.4 cm in diameter. F/u ultrasound recommended in 3 years. He has a h/o tobacco abuse which is a major risk factor. Smoking cessation will need to be stressed. BP is controlled. FLP with low HDL, otherwise, OK.   4. SBO: improving  5. AKI: Renal function improved, stabilized.  At discharge, plan f/u in 3-4 weeks to discuss possible cardioversion, but he may actually have longstanding/permanent AF.  Thurmon FairMihai Cellie Dardis, MD, [facility] [phone] office [phone] pager 10/27/2015 11:53 AM

## 2015-10-27 NOTE — Care Management Note (Signed)
Case Management Note  Patient Details  Name: Jesse Barber MRN: [id number] Date of Birth: 01-24-1929  Subjective/Objective:      Pt lives with family, will be discharged on Eliquis.  Pt has Medicaid and Eliquis is on preferred list - copay will be $3.00.  CM will provide son with card for free 30-day trial offer.                  Expected Discharge Plan:  Home/Self Care  Discharge planning Services  CM Consult  PStatus of Service:  Completed, signed off  Medicare Important Message Given:  Yes  Magdalene RiverMayo, Takesha Steger T, RN 10/27/2015, 10:05 AM

## 2015-10-27 NOTE — Progress Notes (Signed)
Patient ID: Jesse Barber, male   DOB: 26-May-1929, 82 y.o.   MRN: [id number]     [facility]      [address] [place], [place] [address]    Phone: [phone] FAX: [phone]     Subjective: Pt confused.  No n/v. Recorded BM with suppository.   Objective:  Vital signs:  Filed Vitals:   10/26/15 1746 10/26/15 2105 10/27/15 0030 10/27/15 0439  BP: 154/86 146/94  147/96  Pulse: 75 81  81  Temp: 97.3 F (36.3 C) 98.4 F (36.9 C) 98.2 F (36.8 C) 98.3 F (36.8 C)  TempSrc: Axillary Axillary Oral Oral  Resp:  21  20  Height:      Weight:      SpO2: 98% 97%  98%    Last BM Date: 10/25/15  Intake/Output   Yesterday:  03/14 0701 - 03/15 0700 In: 326.3 [P.O.:120; I.V.:206.3] Out: 400 [Urine:400] This shift:      Physical Exam: General: Pt awake/alert and oriented to person.  NAD.   Abdomen: Soft.  Nondistended.  Non tender.  No evidence of peritonitis.  No incarcerated hernias.    Problem List:   Principal Problem:   SBO (small bowel obstruction) (HCC) Active Problems:   Dementia   Language barrier to communication   COPD (chronic obstructive pulmonary disease) (HCC)   AKI (acute kidney injury) ([place])   Nausea and vomiting in adult   Tobacco abuse disorder   AAA (abdominal aortic aneurysm) without rupture (HCC)   Abnormal EKG   Atrial fibrillation with rapid ventricular response (HCC)   Elevated troponin   Chronic cor pulmonale ([place])    Results:   Labs: Results for orders placed or performed during the hospital encounter of 10/23/15 (from the past 48 hour(s))  Heparin level (unfractionated)     Status: Abnormal   Collection Time: 10/25/15  3:58 PM  Result Value Ref Range   Heparin Unfractionated 0.15 (L) 0.30 - 0.70 IU/mL    Comment:        IF HEPARIN RESULTS ARE BELOW EXPECTED VALUES, AND PATIENT DOSAGE HAS BEEN CONFIRMED, SUGGEST FOLLOW UP TESTING OF ANTITHROMBIN III LEVELS.   Digoxin level      Status: Abnormal   Collection Time: 10/25/15  6:01 PM  Result Value Ref Range   Digoxin Level 0.4 (L) 0.8 - 2.0 ng/mL  Comprehensive metabolic panel     Status: Abnormal   Collection Time: 10/26/15  3:32 AM  Result Value Ref Range   Sodium 141 135 - 145 mmol/L   Potassium 4.9 3.5 - 5.1 mmol/L    Comment: DELTA CHECK NOTED   Chloride 111 101 - 111 mmol/L   CO2 18 (L) 22 - 32 mmol/L   Glucose, Bld 86 65 - 99 mg/dL   BUN 32 (H) 6 - 20 mg/dL   Creatinine, Ser 1.35 (H) 0.61 - 1.24 mg/dL   Calcium 8.1 (L) 8.9 - 10.3 mg/dL   Total Protein 5.6 (L) 6.5 - 8.1 g/dL   Albumin 2.9 (L) 3.5 - 5.0 g/dL   AST 23 15 - 41 U/L   ALT 8 (L) 17 - 63 U/L   Alkaline Phosphatase 49 38 - 126 U/L   Total Bilirubin 2.0 (H) 0.3 - 1.2 mg/dL   GFR calc non Af Amer 47 (L) >60 mL/min   GFR calc Af Amer 54 (L) >60 mL/min    Comment: (NOTE) The eGFR has been calculated using the CKD EPI equation.  This calculation has not been validated in all clinical situations. eGFR's persistently <60 mL/min signify possible Chronic Kidney Disease.    Anion gap 12 5 - 15  Heparin level (unfractionated)     Status: Abnormal   Collection Time: 10/26/15  3:32 AM  Result Value Ref Range   Heparin Unfractionated 0.20 (L) 0.30 - 0.70 IU/mL    Comment:        IF HEPARIN RESULTS ARE BELOW EXPECTED VALUES, AND PATIENT DOSAGE HAS BEEN CONFIRMED, SUGGEST FOLLOW UP TESTING OF ANTITHROMBIN III LEVELS.   Lipid panel     Status: Abnormal   Collection Time: 10/26/15  3:32 AM  Result Value Ref Range   Cholesterol 151 0 - 200 mg/dL   Triglycerides 120 <150 mg/dL   HDL 32 (L) >40 mg/dL   Total CHOL/HDL Ratio 4.7 RATIO   VLDL 24 0 - 40 mg/dL   LDL Cholesterol 95 0 - 99 mg/dL    Comment:        Total Cholesterol/HDL:CHD Risk Coronary Heart Disease Risk Table                     Men   Women  1/2 Average Risk   3.4   3.3  Average Risk       5.0   4.4  2 X Average Risk   9.6   7.1  3 X Average Risk  23.4   11.0        Use the  calculated Patient Ratio above and the CHD Risk Table to determine the patient's CHD Risk.        ATP III CLASSIFICATION (LDL):  <100     mg/dL   Optimal  100-129  mg/dL   Near or Above                    Optimal  130-159  mg/dL   Borderline  160-189  mg/dL   High  >190     mg/dL   Very High   Heparin level (unfractionated)     Status: Abnormal   Collection Time: 10/26/15  2:05 PM  Result Value Ref Range   Heparin Unfractionated 0.27 (L) 0.30 - 0.70 IU/mL    Comment:        IF HEPARIN RESULTS ARE BELOW EXPECTED VALUES, AND PATIENT DOSAGE HAS BEEN CONFIRMED, SUGGEST FOLLOW UP TESTING OF ANTITHROMBIN III LEVELS.   Comprehensive metabolic panel     Status: Abnormal   Collection Time: 10/27/15  2:35 AM  Result Value Ref Range   Sodium 144 135 - 145 mmol/L   Potassium 4.4 3.5 - 5.1 mmol/L   Chloride 111 101 - 111 mmol/L   CO2 22 22 - 32 mmol/L   Glucose, Bld 101 (H) 65 - 99 mg/dL   BUN 31 (H) 6 - 20 mg/dL   Creatinine, Ser 1.39 (H) 0.61 - 1.24 mg/dL   Calcium 8.6 (L) 8.9 - 10.3 mg/dL   Total Protein 6.0 (L) 6.5 - 8.1 g/dL   Albumin 2.9 (L) 3.5 - 5.0 g/dL   AST 29 15 - 41 U/L   ALT 18 17 - 63 U/L   Alkaline Phosphatase 51 38 - 126 U/L   Total Bilirubin 1.1 0.3 - 1.2 mg/dL   GFR calc non Af Amer 45 (L) >60 mL/min   GFR calc Af Amer 52 (L) >60 mL/min    Comment: (NOTE) The eGFR has been calculated using the CKD EPI equation. This calculation has  not been validated in all clinical situations. eGFR's persistently <60 mL/min signify possible Chronic Kidney Disease.    Anion gap 11 5 - 15  Magnesium     Status: None   Collection Time: 10/27/15  2:35 AM  Result Value Ref Range   Magnesium 1.7 1.7 - 2.4 mg/dL    Imaging / Studies: Nm Myocar Multi W/spect W/wall Motion / Ef  10/26/2015  CLINICAL DATA:  82 year old with chest pain. Pre operative evaluation. EXAM: MYOCARDIAL IMAGING WITH SPECT (REST AND PHARMACOLOGIC-STRESS) GATED LEFT VENTRICULAR WALL MOTION STUDY LEFT  VENTRICULAR EJECTION FRACTION TECHNIQUE: Standard myocardial SPECT imaging was performed after resting intravenous injection of 10 mCi Tc-44msestamibi. Subsequently, intravenous infusion of Lexiscan was performed under the supervision of the Cardiology staff. At peak effect of the drug, 30 mCi Tc-931mestamibi was injected intravenously and standard myocardial SPECT imaging was performed. Quantitative gated imaging was also performed to evaluate left ventricular wall motion, and estimate left ventricular ejection fraction. COMPARISON:  None. FINDINGS: Perfusion: Large fixed defect involving the inferior wall. Fixed defect involving the inferoseptal wall. Question a small amount of reversibility along the apex. This finding is equivocal. No other areas are concerning for reversibility or ischemia. Wall Motion: Hypokinesia involving the septal wall and inferoseptal wall. Left Ventricular Ejection Fraction: 48 % End diastolic volume 80 ml End systolic volume 41 ml IMPRESSION: 1. Large fixed defect involving the inferior wall and inferoseptal wall. Findings are suggestive for an infarct. 2. Equivocal reversibility at the apex. Cannot exclude a small area of ischemia at this location. 3. Left ventricular ejection fraction is 48%. Hypokinesia along the septal wall and inferoseptal wall. 4. Intermediate-risk stress test findings*. *2012 Appropriate Use Criteria for Coronary Revascularization Focused Update: J Am Coll Cardiol. 206811;57(2):620-355http://content.onairportbarriers.comspx?articleid=[email or web address] Electronically Signed   By: AdMarkus Daft.D.   On: 10/26/2015 14:37   Dg Abd 2 Views  10/26/2015  CLINICAL DATA:  Followup small bowel obstruction. EXAM: ABDOMEN - 2 VIEW COMPARISON:  10/25/2015 FINDINGS: Improved bowel gas pattern with decompression of the dilated small bowel loops. Progression of oral contrast into the distal colon and rectum. No free air. The lung bases are clear. IMPRESSION: Resolution of small  bowel obstruction. Electronically Signed   By: P.Marijo Sanes.D.   On: 10/26/2015 13:32    Medications / Allergies:  Scheduled Meds: . amitriptyline  25 mg Oral QHS  . apixaban  2.5 mg Oral BID  . aspirin  81 mg Oral Daily  . cefTRIAXone (ROCEPHIN)  IV  1 g Intravenous Q24H  . darifenacin  7.5 mg Oral Daily  . digoxin  0.0625 mg Oral Daily  . donepezil  5 mg Oral QHS  . metoprolol tartrate  25 mg Oral BID  . pantoprazole (PROTONIX) IV  40 mg Intravenous Q24H   Continuous Infusions: . sodium chloride 50 mL/hr at 10/26/15 1339   PRN Meds:.metoprolol, morphine injection, [DISCONTINUED] ondansetron **OR** ondansetron (ZOFRAN) IV  Antibiotics: Anti-infectives    Start     Dose/Rate Route Frequency Ordered Stop   10/25/15 1800  cefTRIAXone (ROCEPHIN) 1 g in dextrose 5 % 50 mL IVPB     1 g 100 mL/hr over 30 Minutes Intravenous Every 24 hours 10/25/15 1117 10/29/15 1759   10/24/15 0100  piperacillin-tazobactam (ZOSYN) IVPB 2.25 g  Status:  Discontinued     2.25 g 100 mL/hr over 30 Minutes Intravenous 3 times per day 10/23/15 1837 10/25/15 1117   10/23/15 1700  piperacillin-tazobactam (ZOSYN) IVPB 3.375 g  3.375 g 100 mL/hr over 30 Minutes Intravenous  Once 10/23/15 1654 10/23/15 1810        Assessment/Plan SBO-resolved.  Advance diet.  Please call [facility] with further assistance.   Erby Pian, [facility] Pager [phone]) For consults and floor pages call [phone]([phone])  10/27/2015 7:38 AM

## 2015-10-27 NOTE — Progress Notes (Signed)
Pt's wrist restraints taken off, range of motion exercises done with patient, hydration provided. Pt reoriented. Restraint reapplied. Will continue to monitor.

## 2015-10-28 LAB — CBC
HEMATOCRIT: 43.6 % (ref 39.0–52.0)
Hemoglobin: 14.5 g/dL (ref 13.0–17.0)
MCH: 31.3 pg (ref 26.0–34.0)
MCHC: 33.3 g/dL (ref 30.0–36.0)
MCV: 94.2 fL (ref 78.0–100.0)
Platelets: 214 10*3/uL (ref 150–400)
RBC: 4.63 MIL/uL (ref 4.22–5.81)
RDW: 14.5 % (ref 11.5–15.5)
WBC: 9 10*3/uL (ref 4.0–10.5)

## 2015-10-28 LAB — CULTURE, BLOOD (ROUTINE X 2)
CULTURE: NO GROWTH
Culture: NO GROWTH

## 2015-10-28 LAB — BASIC METABOLIC PANEL
Anion gap: 9 (ref 5–15)
BUN: 23 mg/dL — AB (ref 6–20)
CALCIUM: 8.3 mg/dL — AB (ref 8.9–10.3)
CO2: 20 mmol/L — AB (ref 22–32)
Chloride: 115 mmol/L — ABNORMAL HIGH (ref 101–111)
Creatinine, Ser: 1.13 mg/dL (ref 0.61–1.24)
GFR calc Af Amer: >60 mL/min (ref >60)
GFR, EST NON AFRICAN AMERICAN: 57 mL/min — AB (ref >60)
GLUCOSE: 91 mg/dL (ref 65–99)
POTASSIUM: 4 mmol/L (ref 3.5–5.1)
Sodium: 144 mmol/L (ref 135–145)

## 2015-10-28 MED ORDER — ASPIRIN 81 MG PO CHEW: 81.0000 mg | CHEWABLE_TABLET | Freq: Every day | ORAL | Status: DC

## 2015-10-28 MED ORDER — METOPROLOL TARTRATE 25 MG PO TABS: 25.0000 mg | ORAL_TABLET | Freq: Two times a day (BID) | ORAL | Status: DC

## 2015-10-28 MED ORDER — APIXABAN 2.5 MG PO TABS: 2.5000 mg | ORAL_TABLET | Freq: Two times a day (BID) | ORAL | Status: AC

## 2015-10-28 MED ORDER — PANTOPRAZOLE SODIUM 40 MG PO TBEC: 40.0000 mg | DELAYED_RELEASE_TABLET | Freq: Every day | ORAL | Status: AC

## 2015-10-28 NOTE — Progress Notes (Signed)
Patient Name: Jesse Barber Date of Encounter: 10/28/2015   Patient Profile: Jesse Barber is a 82 year old [place] gentleman who does not speak English with past medical history of smoking, dementia, COPD, mild depression and osteoarthritis who was admitted to the hospital 3 days ago with chief complaints of nausea vomiting and some abdominal discomfort he was diagnosed with small bowel obstruction, he was also found to have atrial fibrillation, general surgery cardiology were consulted. Also incidental finding of a 3.4 cm AAA w/o rupture.    SUBJECTIVE: Resting, no signs of pain or discomfort.   OBJECTIVE Filed Vitals:   10/27/15 1928 10/27/15 2135 10/28/15 0522 10/28/15 0955  BP: 149/70 149/76 146/79 153/90  Pulse: 76 63 58 62  Temp:  98.5 F (36.9 C) 97.6 F (36.4 C)   TempSrc:  Oral Oral   Resp: Height:      Weight:      SpO2: 99% 90% 91%     Intake/Output Summary (Last 24 hours) at 10/28/15 1016 Last data filed at 10/27/15 1700  Gross per 24 hour  Intake    490 ml  Output      0 ml  Net    490 ml   Filed Weights   10/23/15 1639 10/23/15 2315  Weight: 120 lb 3.2 oz (54.522 kg) 124 lb 9 oz (56.5 kg)    PHYSICAL EXAM General: Well developed, well nourished, male in no acute distress. Head: Normocephalic, atraumatic.  Neck: Supple without bruits, No JVD. Lungs:  Resp regular and unlabored, CTA. Heart: RRR, S1, S2, no S3, S4, No murmur; no rub. Abdomen: Soft, non-tender, non-distended, BS + x 4.  Extremities: No clubbing, cyanosis, No edema.  Neuro: Alert and oriented X 3. Moves all extremities spontaneously. Psych: Normal affect.  LABS: CBC: Recent Labs  10/28/15 0645  WBC 9.0  HGB 14.5  HCT 43.6  MCV 94.2  PLT 214   INR:No results for input(s): INR in the last 72 hours. Basic Metabolic Panel: Recent Labs  10/27/15 0235 10/28/15 0645  NA 144 144  K 4.4 4.0  CL 111 115*  CO2 22 20*  GLUCOSE 101* 91  BUN 31* 23*  CREATININE 1.39*  1.13  CALCIUM 8.6* 8.3*  MG 1.7  --    Liver Function Tests: Recent Labs  10/26/15 0332 10/27/15 0235  AST 23 29  ALT 8* 18  ALKPHOS 49 51  BILITOT 2.0* 1.1  PROT 5.6* 6.0*  ALBUMIN 2.9* 2.9*   Fasting Lipid Panel: Recent Labs  10/26/15 0332  CHOL 151  HDL 32*  LDLCALC 95  TRIG 161  CHOLHDL 4.7    TELE:  Afib rate 60-70     Radiology/Studies: Nm Myocar Multi W/spect W/wall Motion / Ef  10/26/2015  CLINICAL DATA:  82 year old with chest pain. Pre operative evaluation. EXAM: MYOCARDIAL IMAGING WITH SPECT (REST AND PHARMACOLOGIC-STRESS) GATED LEFT VENTRICULAR WALL MOTION STUDY LEFT VENTRICULAR EJECTION FRACTION TECHNIQUE: Standard myocardial SPECT imaging was performed after resting intravenous injection of 10 mCi Tc-106m sestamibi. Subsequently, intravenous infusion of Lexiscan was performed under the supervision of the Cardiology staff. At peak effect of the drug, 30 mCi Tc-43m sestamibi was injected intravenously and standard myocardial SPECT imaging was performed. Quantitative gated imaging was also performed to evaluate left ventricular wall motion, and estimate left ventricular ejection fraction. COMPARISON:  None. FINDINGS: Perfusion: Large fixed defect involving the inferior wall. Fixed defect involving the inferoseptal wall. Question a small amount of reversibility along the  apex. This finding is equivocal. No other areas are concerning for reversibility or ischemia. Wall Motion: Hypokinesia involving the septal wall and inferoseptal wall. Left Ventricular Ejection Fraction: 48 % End diastolic volume 80 ml End systolic volume 41 ml IMPRESSION: 1. Large fixed defect involving the inferior wall and inferoseptal wall. Findings are suggestive for an infarct. 2. Equivocal reversibility at the apex. Cannot exclude a small area of ischemia at this location. 3. Left ventricular ejection fraction is 48%. Hypokinesia along the septal wall and inferoseptal wall. 4. Intermediate-risk stress  test findings*. *2012 Appropriate Use Criteria for Coronary Revascularization Focused Update: J Am Coll Cardiol. 2012;59(9):857-881. http://content.dementiazones.comonlinejacc.org/article.aspx?articleid=1201161 Electronically Signed   By: Richarda OverlieAdam  Henn M.D.   On: 10/26/2015 14:37   Dg Abd 2 Views  10/26/2015  CLINICAL DATA:  Followup small bowel obstruction. EXAM: ABDOMEN - 2 VIEW COMPARISON:  10/25/2015 FINDINGS: Improved bowel gas pattern with decompression of the dilated small bowel loops. Progression of oral contrast into the distal colon and rectum. No free air. The lung bases are clear. IMPRESSION: Resolution of small bowel obstruction. Electronically Signed   By: Rudie MeyerP.  Gallerani M.D.   On: 10/26/2015 13:32     Current Medications:  . amitriptyline  25 mg Oral QHS  . apixaban  2.5 mg Oral BID  . aspirin  81 mg Oral Daily  . darifenacin  7.5 mg Oral Daily  . donepezil  5 mg Oral QHS  . metoprolol tartrate  25 mg Oral BID  . pantoprazole  40 mg Oral Daily   . sodium chloride 50 mL/hr at 10/27/15 1316    ASSESSMENT AND PLAN: Principal Problem:   SBO (small bowel obstruction) (HCC) Active Problems:   Dementia   Language barrier to communication   COPD (chronic obstructive pulmonary disease) (HCC)   AKI (acute kidney injury) (HCC)   Nausea and vomiting in adult   Tobacco abuse disorder   AAA (abdominal aortic aneurysm) without rupture (HCC)   Abnormal EKG   Atrial fibrillation with rapid ventricular response (HCC)   Elevated troponin   Chronic cor pulmonale (HCC)  1. New onset atrial fibrillation of unknown duration.  - Patient is asymptomatic with this. His rate is well controlled on BB therapy.  -This patients CHA2DS2-VASc Score and unadjusted Ischemic Stroke Rate (% per year) is equal to 3.2 % stroke rate/year from a score of 3 (age>75 and vascular disease).  - Continue Eliquis and metoprolol - Continue metoprolol for rate control.   2. Elevated Troponin:  - likely demand  ischemia  3. AAA:  - Incidental finding on CT of abdomen and pelvis. 3.4 cm in diameter. F/u ultrasound recommended in 3 years. He has a h/o tobacco abuse which is a major risk factor. Smoking cessation will need to be stressed. BP is controlled. FLP with low HDL, otherwise, OK.   4. SBO - Improving  5. AKI - Renal function improved, stabilized.  At discharge, plan f/u in 3-4 weeks to discuss possible cardioversion, but he may actually have longstanding/permanent AF.    Signed, Little IshikawaErin E Smith , AGNP-C 10:16 AM 10/28/2015   I have seen and examined the patient along with Little IshikawaErin E Smith , AGNP-C.  I have reviewed the chart, notes and new data.  I agree with PA/NP's note.  PLAN:  Good ventricular rate control and no immediate complications with anticoagulation. Will arrange outpt follow up. No new recommendations.  Thurmon FairMihai Altheria Shadoan, MD, [facility] KentuckyFACC [facility] [phone] 10/28/2015, 12:36 PM

## 2015-10-28 NOTE — Clinical Social Work Placement (Signed)
   CLINICAL SOCIAL WORK PLACEMENT  NOTE  Date:  10/28/2015  Patient Details  Name: Jesse Barber MRN: [id number] Date of Birth: [birth year]  Clinical Social Work is seeking post-discharge placement for this patient at the Skilled  Nursing Facility level of care (*CSW will initial, date and re-position this form in  chart as items are completed):  Yes   Patient/family provided with [place] Clinical Social Work Department's list of facilities offering this level of care within the geographic area requested by the patient (or if unable, by the patient's family).  Yes   Patient/family informed of their freedom to choose among providers that offer the needed level of care, that participate in Medicare, Medicaid or managed care program needed by the patient, have an available bed and are willing to accept the patient.  Yes   Patient/family informed of [facility]'s ownership interest in [facility] and [facility], as well as of the fact that they are under no obligation to receive care at these facilities.  PASRR submitted to EDS on 10/28/15     PASRR number received on 10/28/15     Existing PASRR number confirmed on       FL2 transmitted to all facilities in geographic area requested by pt/family on 10/28/15     FL2 transmitted to all facilities within larger geographic area on       Patient informed that his/her managed care company has contracts with or will negotiate with certain facilities, including the following:        Yes   Patient/family informed of bed offers received.  [facility]     Physician recommends and patient chooses bed at      [facility].  Patient to be transferred to facility by [facility]     Patient family notified on 10/28/15 of transfer.  Name of family member notified:  Son     PHYSICIAN       Additional Comment:     _______________________________________________ Mearl LatinNadia S Scotlynn Noyes, LCSWA 10/28/2015, 4:17 PM

## 2015-10-28 NOTE — Progress Notes (Signed)
Nsg Discharge Note  Admit Date:  10/23/2015 Discharge date: 10/28/2015   Jesse Barber to be D/C'd Jesse Barber per MD order.  Report called to Jesse CiproSana Myers, RN at facility. No questions or concerns voiced when asked. Pt son is aware that pt is going to SNF.   Discharge Medication:   Medication List    TAKE these medications        amitriptyline 25 MG tablet  Commonly known as:  ELAVIL  Take 25 mg by mouth at bedtime.     apixaban 2.5 MG Tabs tablet  Commonly known as:  ELIQUIS  Take 1 tablet (2.5 mg total) by mouth 2 (two) times daily.     aspirin 81 MG chewable tablet  Chew 1 tablet (81 mg total) by mouth daily.     [employer] VIT D 5000 HIGH-POTENCY 5000 units capsule  Generic drug:  Cholecalciferol  Take 5,000 Units by mouth daily.     donepezil 5 MG tablet  Commonly known as:  ARICEPT  Take 5 mg by mouth at bedtime.     metoprolol tartrate 25 MG tablet  Commonly known as:  LOPRESSOR  Take 1 tablet (25 mg total) by mouth 2 (two) times daily.     pantoprazole 40 MG tablet  Commonly known as:  PROTONIX  Take 1 tablet (40 mg total) by mouth daily.     solifenacin 10 MG tablet  Commonly known as:  VESICARE  Take by mouth daily.        Discharge Assessment: Filed Vitals:   10/28/15 0955 10/28/15 1429  BP: 153/90 121/66  Pulse: 62 69  Temp:  97.9 F (36.6 C)  Resp:  16   Skin clean, dry and intact without evidence of skin break down, no evidence of skin tears noted. IV catheter discontinued with catheter tip intact. Site without signs and symptoms of complications - no redness or edema noted at insertion site, patient denies c/o pain - only slight tenderness at site.  Dressing with slight pressure applied.  Central telemetry called to inform that pt will be taken off of monitor for discharge.   Pt waiting for transport at this time. Patient up in chair at this time. Call bell within reach. Son in room with pt at this time.   Jesse Chadracy Maxton Noreen, RN 10/28/2015 3:19 PM

## 2015-10-28 NOTE — Evaluation (Signed)
Physical Therapy Evaluation Patient Details Name: Jesse Barber Shew MRN: [id number] DOB: February 17, 1929 Today's Date: 10/28/2015   History of Present Illness  Pt adm with SBO and acute kidney injury. PMH - dementia, COPD, OA  Clinical Impression  Pt admitted with above diagnosis and presents to PT with functional limitations due to deficits listed below (See PT problem list). Pt needs skilled PT to maximize independence and safety to allow discharge to ST-SNF. Pt requiring assistance with basic mobility and pt's son unable to assist due to recent LUE surgery.      Follow Up Recommendations SNF    Equipment Recommendations  Other (comment) (To be determined)    Recommendations for Other Services       Precautions / Restrictions Precautions Precautions: Fall Restrictions Weight Bearing Restrictions: No      Mobility  Bed Mobility Overal bed mobility: Needs Assistance Bed Mobility: Supine to Sit;Sit to Supine     Supine to sit: Supervision Sit to supine: Min assist   General bed mobility comments: Assist to bring legs back up int bed  Transfers Overall transfer level: Needs assistance Equipment used: 1 person hand held assist Transfers: Sit to/from Stand Sit to Stand: Min assist         General transfer comment: Assist to bring hips up and for balance. Pt with posterior lean  Ambulation/Gait Ambulation/Gait assistance: Mod assist;Min assist Ambulation Distance (Feet): 80 Feet Assistive device: 1 person hand held assist Gait Pattern/deviations: Step-to pattern;Decreased step length - right;Decreased step length - left;Decreased stance time - right;Shuffle;Leaning posteriorly Gait velocity: decr Gait velocity interpretation: <1.8 ft/sec, indicative of risk for recurrent falls General Gait Details: Assist due to posterior lean and due to giving of rt knee  Stairs            Wheelchair Mobility    Modified Rankin (Stroke Patients Only)       Balance Overall  balance assessment: Needs assistance Sitting-balance support: No upper extremity supported;Feet supported Sitting balance-Leahy Scale: Good     Standing balance support: Single extremity supported Standing balance-Leahy Scale: Poor Standing balance comment: upper extremity support                             Pertinent Vitals/Pain Pain Assessment: Faces Faces Pain Scale: No hurt    Home Living Family/patient expects to be discharged to:: Private residence Living Arrangements: Children Available Help at Discharge: Family (Son can't physically assist due to recent shoulder surgery) Type of Home: House Home Access: Level entry     Home Layout: One level (except sunken living room) Home Equipment: None      Prior Function Level of Independence: Independent               Hand Dominance        Extremity/Trunk Assessment   Upper Extremity Assessment: Defer to OT evaluation           Lower Extremity Assessment: Generalized weakness         Communication   Communication: Prefers language other than [place];Interpreter utilized (son interpreted)  Cognition Arousal/Alertness: Awake/alert Behavior During Therapy: WFL for tasks assessed/performed Overall Cognitive Status: History of cognitive impairments - at baseline                      General Comments      Exercises        Assessment/Plan    PT Assessment Patient needs continued PT services  PT Diagnosis Difficulty walking;Generalized weakness   PT Problem List Decreased strength;Decreased activity tolerance;Decreased balance;Decreased mobility  PT Treatment Interventions DME instruction;Gait training;Functional mobility training;Therapeutic activities;Therapeutic exercise;Balance training;Patient/family education   PT Goals (Current goals can be found in the Care Plan section) Acute Rehab PT Goals Patient Stated Goal: Son needs pt to be more independent with mobility PT Goal  Formulation: With family Time For Goal Achievement: 11/04/15 Potential to Achieve Goals: Good    Frequency Min 3X/week   Barriers to discharge Decreased caregiver support Son has LUE in sling due to recent surgery    Co-evaluation               End of Session Equipment Utilized During Treatment: Gait belt Activity Tolerance: Patient limited by fatigue Patient left: in bed;with call bell/phone within reach;with bed alarm set Nurse Communication: Mobility status         Time: 4098-1191 PT Time Calculation (min) (ACUTE ONLY): 16 min   Charges:   PT Evaluation $PT Eval Moderate Complexity: 1 Procedure     PT G Codes:        Halleigh Comes 11-20-15, 1:57 PM [facility] PT [phone]

## 2015-10-28 NOTE — Progress Notes (Signed)
RN reported to 3w34 RN. No questions or concerns voiced when asked. RN and tech transferred pt to [address]. Pt son accompanied pt. Jesse Barber 10/28/2015 4:54 PM

## 2015-10-28 NOTE — Discharge Summary (Signed)
Physician Discharge Summary  Jesse Barber UXL:[id number] DOB: 23-Jul-1929 DOA: 10/23/2015  PCP: [name], MD  Admit date: 10/23/2015 Discharge date: 10/28/2015  Time spent: 45 minutes  Recommendations for Outpatient Follow-up:  1. continue blood thinner as well as metoprolol 25  Bid 2. Needs close follow-upw ith cardiology as an out-patient    Discharge Diagnoses:  Principal Problem:   SBO (small bowel obstruction) (HCC) Active Problems:   Dementia   Language barrier to communication   COPD (chronic obstructive pulmonary disease) (HCC)   AKI (acute kidney injury) (HCC)   Nausea and vomiting in adult   Tobacco abuse disorder   AAA (abdominal aortic aneurysm) without rupture (HCC)   Abnormal EKG   Atrial fibrillation with rapid ventricular response (HCC)   Elevated troponin   Chronic cor pulmonale (HCC)   Discharge Condition: fair  Diet recommendation: soft diet  Filed Weights   10/23/15 1639 10/23/15 2315  Weight: 54.522 kg (120 lb 3.2 oz) 56.5 kg (124 lb 9 oz)    History of present illness:  82 year old [place] gentleman who does not speak English with past medical history of smoking, dementia, COPD, mild depression and osteoarthritis who was admitted c nausea vomiting and some abdominal discomfort he was diagnosed with small bowel obstruction, he was also found to have atrial fibrillation, general surgery cardiology were consulted   Hospital Course:   1.Small bowel obstruction. Discussed with family members and patient. Patient pulled NG tube, seen by general surgery, now passing flatus. Advanced to soft diet on d/c  2. Newly diagnosed paroxysmal atrial fibrillation. [place] vasc 2 score =3. Was in RVR, placed on IV/PO digoxin along with low-dose beta blocker and as needed IV Lopressor, stable TSH , stable echogram with an EF of 60%, no wall motion abnormality or diastolic dysfunction, cardiology on board. Changed to PO metoprolol, digoxin d/c, start elliquis 2.5  bid Close OP f/u 3-4 wk for ? DCCV as per cardiology arrangements  3. Dehydration, ARF and hypokalemia due to emesis from SBO. Improving with hydration note we do not have a baseline creatinine, he might have some element of CKD stage unknown.   4. History of ongoing smoking and COPD. At baseline, supportive care only. Counseled to quit smoking.  5. AAA 3.4 cm aneurysm diagnosed on CT scan. Outpatient vascular surgery follow-up and monitoring, have placed him on beta blocker .  6. Mild nonspecific elevation of troponin in non-ACS pattern due to A. fib RVR. Chest pain-free, EKG nonacute, supportive care, place on aspirin and beta blocker, per cardiology will require a stress test in the next few days.  7. UTI - switched from Zosyn to Rocephin. as cultures are neg, d/c Abx 3/15  8. History of dementia. Resume home medications, at risk for Delirium - son explained.  9. Smoker-unlikely will quit   Discharge Exam: Filed Vitals:   10/28/15 0522 10/28/15 0955  BP: 146/79 153/90  Pulse: 58 62  Temp: 97.6 F (36.4 C)   Resp: 16     Well no issues. Some smears of stool per RN No other c/o  General: eomi ncat Cardiovascular: s1 s2 no m/r/g Respiratory: clear no added sound Abd soft nt nd  Discharge Instructions    Current Discharge Medication List    START taking these medications   Details  apixaban (ELIQUIS) 2.5 MG TABS tablet Take 1 tablet (2.5 mg total) by mouth 2 (two) times daily. Qty: 60 tablet, Refills: 0    aspirin 81 MG chewable tablet Chew 1  tablet (81 mg total) by mouth daily. Qty: 30 tablet, Refills: 0    metoprolol tartrate (LOPRESSOR) 25 MG tablet Take 1 tablet (25 mg total) by mouth 2 (two) times daily. Qty: 60 tablet, Refills: 0    pantoprazole (PROTONIX) 40 MG tablet Take 1 tablet (40 mg total) by mouth daily. Qty: 30 tablet, Refills: 0      CONTINUE these medications which have NOT CHANGED   Details  amitriptyline (ELAVIL) 25 MG tablet Take 25 mg  by mouth at bedtime.    [employer] VIT D 5000 HIGH-POTENCY 5000 units capsule Take 5,000 Units by mouth daily. Refills: 2    donepezil (ARICEPT) 5 MG tablet Take 5 mg by mouth at bedtime.    solifenacin (VESICARE) 10 MG tablet Take by mouth daily.       No Known Allergies    The results of significant diagnostics from this hospitalization (including imaging, microbiology, ancillary and laboratory) are listed below for reference.    Significant Diagnostic Studies: Ct Abdomen Pelvis Wo Contrast  10/23/2015  CLINICAL DATA:  Nausea and vomiting with generalized abdominal pain. EXAM: CT ABDOMEN AND PELVIS WITHOUT CONTRAST TECHNIQUE: Multidetector CT imaging of the abdomen and pelvis was performed following the standard protocol without IV contrast. COMPARISON:  Acute abdomen series from earlier today. FINDINGS: Lower chest: Emphysema noted in the lung bases with bronchial wall thickening and dependent atelectasis. Hepatobiliary: No focal abnormality in the liver on this study without intravenous contrast. No evidence of hepatomegaly. Gallbladder is distended. No intrahepatic or extrahepatic biliary dilation. Pancreas: No focal mass lesion. No dilatation of the main duct. No intraparenchymal cyst. No peripancreatic edema. Spleen: No splenomegaly. No focal mass lesion. Adrenals/Urinary Tract: No adrenal nodule or mass. No renal stones or hydronephrosis. No definite renal mass lesion on this study without intravenous contrast material. No evidence for hydroureter. The urinary bladder appears normal for the degree of distention. Stomach/Bowel: Mild circumferential wall thickening is seen in the distal esophagus and there is a small hiatal hernia. Stomach otherwise unremarkable. Duodenum is normally positioned as is the ligament of Treitz. Proximal jejunal loops are opacified and not substantially dilated. However, mid small bowel is air-filled and dilated up to 4.2 cm in diameter. The distal ileal loops are  completely decompressed and the terminal ileum is visible on image 41 series 21. No definite transition zone can be identified. The colon is largely decompressed throughout. No evidence for bowel wall thickening. No pneumatosis. Vascular/Lymphatic: Abdominal aortic atherosclerosis noted with infrarenal abdominal aorta measuring up to 3.4 cm in diameter. There is no gastrohepatic or hepatoduodenal ligament lymphadenopathy. No intraperitoneal or retroperitoneal lymphadenopathy. No pelvic sidewall lymphadenopathy. Reproductive: The prostate gland and seminal vesicles have normal imaging features. Other: No intraperitoneal free fluid. No evidence for interloop mesenteric fluid Musculoskeletal: Patient is status post ORIF for proximal left femur fracture. Bones are diffusely demineralized Bone windows reveal no worrisome lytic or sclerotic osseous lesions. IMPRESSION: 1. Dilated small bowel up to 4.2 cm compatible with small bowel obstruction. The point of obstruction is probably in the mid to distal small bowel although a discrete transition zone cannot be identified on the current exam. There is no associated pneumatosis or bowel wall thickening. No interloop mesenteric fluid. 2. Circumferential wall thickening in the distal esophagus associated with small hiatal hernia. Changes in the esophagus may be related and reflux. 3. 3.4 cm abdominal aortic aneurysm. Recommend followup by ultrasound in 3 years. This recommendation follows ACR consensus guidelines: White Paper of the ACR Incidental  Findings Committee II on Vascular Findings. [place] Coll Radiol 2013; 10:789-794 Electronically Signed   By: Kennith Center M.D.   On: 10/23/2015 18:57   Nm Myocar Multi W/spect W/wall Motion / Ef  10/26/2015  CLINICAL DATA:  82 year old with chest pain. Pre operative evaluation. EXAM: MYOCARDIAL IMAGING WITH SPECT (REST AND PHARMACOLOGIC-STRESS) GATED LEFT VENTRICULAR WALL MOTION STUDY LEFT VENTRICULAR EJECTION FRACTION TECHNIQUE:  Standard myocardial SPECT imaging was performed after resting intravenous injection of 10 mCi Tc-13m sestamibi. Subsequently, intravenous infusion of Lexiscan was performed under the supervision of the Cardiology staff. At peak effect of the drug, 30 mCi Tc-33m sestamibi was injected intravenously and standard myocardial SPECT imaging was performed. Quantitative gated imaging was also performed to evaluate left ventricular wall motion, and estimate left ventricular ejection fraction. COMPARISON:  None. FINDINGS: Perfusion: Large fixed defect involving the inferior wall. Fixed defect involving the inferoseptal wall. Question a small amount of reversibility along the apex. This finding is equivocal. No other areas are concerning for reversibility or ischemia. Wall Motion: Hypokinesia involving the septal wall and inferoseptal wall. Left Ventricular Ejection Fraction: 48 % End diastolic volume 80 ml End systolic volume 41 ml IMPRESSION: 1. Large fixed defect involving the inferior wall and inferoseptal wall. Findings are suggestive for an infarct. 2. Equivocal reversibility at the apex. Cannot exclude a small area of ischemia at this location. 3. Left ventricular ejection fraction is 48%. Hypokinesia along the septal wall and inferoseptal wall. 4. Intermediate-risk stress test findings*. *2012 Appropriate Use Criteria for Coronary Revascularization Focused Update: J Am Coll Cardiol. 2012;59(9):857-881. http://content.dementiazones.com.aspx?articleid=[email or web address] Electronically Signed   By: Richarda Overlie M.D.   On: 10/26/2015 14:37   Dg Chest Port 1 View  10/23/2015  CLINICAL DATA:  Sepsis. EXAM: PORTABLE CHEST 1 VIEW COMPARISON:  Chest and abdominal radiographs earlier this day. Included lung bases from CT abdomen. FINDINGS: Lower lung volumes from prior exam. Developing ill-defined and linear bibasilar opacities. No pulmonary edema. No confluent airspace disease. No pleural effusion or pneumothorax. Dilated bowel  loops in the right upper quadrant. IMPRESSION: Developing ill-defined linear bibasilar opacities, likely atelectasis. Given bowel obstruction, aspiration could have a similar appearance. Electronically Signed   By: Rubye Oaks M.D.   On: 10/23/2015 23:59   Dg Abd 2 Views  10/26/2015  CLINICAL DATA:  Followup small bowel obstruction. EXAM: ABDOMEN - 2 VIEW COMPARISON:  10/25/2015 FINDINGS: Improved bowel gas pattern with decompression of the dilated small bowel loops. Progression of oral contrast into the distal colon and rectum. No free air. The lung bases are clear. IMPRESSION: Resolution of small bowel obstruction. Electronically Signed   By: Rudie Meyer M.D.   On: 10/26/2015 13:32   Dg Abd 2 Views  10/25/2015  CLINICAL DATA:  Small bowel obstruction EXAM: ABDOMEN - 2 VIEW COMPARISON:  10/24/2015; 10/23/2015; CT abdomen pelvis - 10/23/2015 FINDINGS: A small amount of enteric contrast remains within the ascending and transverse colon. Interval removal of enteric tube There is persistent moderate gaseous distention of multiple loops of small bowel with index loop within the left mid abdomen measuring approximately 3.6 cm in diameter and additional loop within the left lower abdominal quadrant measuring approximately 5.3 cm. No pneumoperitoneum, pneumatosis or portal venous gas. Vascular calcifications overlie the lower pelvis bilaterally. Several phleboliths overlie the right lower pelvis. Otherwise, no definitive abnormal intra-abdominal calcifications given overlying colonic stool burden. Limited visualization of the lower thorax is normal. No acute osseus abnormalities. Mild scoliotic curvature of the lumbar spine, convex to the  left with associated moderate severe multilevel lumbar spine DDD. Post dynamic lag in cancellous screw fixation of the left femoral neck and proximal aspect the left femur, incompletely evaluated. Similar findings worrisome for persistent high-grade small bowel obstruction.  IMPRESSION: 1. Similar findings worrisome for persistent small bowel obstruction. 2. Interval removal of enteric tube. Electronically Signed   By: Simonne Come M.D.   On: 10/25/2015 08:13   Dg Abd Acute W/chest  10/23/2015  CLINICAL DATA:  82 year old male with history of abdominal pain. EXAM: DG ABDOMEN ACUTE W/ 1V CHEST COMPARISON:  No priors. FINDINGS: Several small dense nodules in the right hemithorax are most compatible with calcified granulomas. Bilateral nipple shadows incidentally noted. No acute consolidative airspace disease. No pleural effusions. No evidence of pulmonary edema. No pneumothorax. Heart size is normal. Upper mediastinal contours are within normal limits. Atherosclerosis in the thoracic aorta. Numerous dilated loops of small bowel measuring up to 6.3 cm in diameter. There is a small amount of colonic and rectal gas and stool noted. No frank pneumoperitoneum confidently identified. Orthopedic fixation hardware in the left hip incompletely visualize. Numerous vascular calcifications are noted. IMPRESSION: 1. Appearance of the abdomen is compatible with small bowel obstruction, as above. Given the presence of gas and stool in the colon, obstruction may be early, or could be partial. 2. No pneumoperitoneum. 3. No radiographic evidence of acute cardiopulmonary disease. 4. Atherosclerosis. Electronically Signed   By: Trudie Reed M.D.   On: 10/23/2015 14:39   Dg Abd Portable 1v  10/24/2015  CLINICAL DATA:  Small bowel obstruction EXAM: PORTABLE ABDOMEN - 1 VIEW COMPARISON:  CT abdomen pelvis dated 10/23/2015 FINDINGS: Dilated loops of small bowel in the central abdomen, including opacified loops of small bowel in the left mid abdomen. Contrast has not yet reached the colon. Degenerative changes of the lumbar spine. Status post ORIF of the left hip IMPRESSION: Dilated loops of small bowel, as above, compatible with small bowel obstruction. Contrast has not yet reached the colon.  Electronically Signed   By: Charline Bills M.D.   On: 10/24/2015 09:07   Dg Vangie Bicker G Tube Plc W/fl-no Rad  10/24/2015  CLINICAL DATA:  NASO G TUBE PLACEMENT WITH FLUORO Fluoroscopy was utilized by the requesting physician.  No radiographic interpretation.    Microbiology: Recent Results (from the past 240 hour(s))  Blood Culture (routine x 2)     Status: None   Collection Time: 10/23/15  4:25 PM  Result Value Ref Range Status   Specimen Description BLOOD RIGHT FOREARM  Final   Special Requests BOTTLES DRAWN AEROBIC AND ANAEROBIC 5CC  Final   Culture NO GROWTH 5 DAYS  Final   Report Status 10/28/2015 FINAL  Final  Blood Culture (routine x 2)     Status: None   Collection Time: 10/23/15  4:26 PM  Result Value Ref Range Status   Specimen Description BLOOD LEFT FOREARM  Final   Special Requests BOTTLES DRAWN AEROBIC ONLY 5CC  Final   Culture NO GROWTH 5 DAYS  Final   Report Status 10/28/2015 FINAL  Final  Urine culture     Status: None   Collection Time: 10/23/15  9:16 PM  Result Value Ref Range Status   Specimen Description URINE, CLEAN CATCH  Final   Special Requests NONE  Final   Culture MULTIPLE SPECIES PRESENT, SUGGEST RECOLLECTION  Final   Report Status 10/25/2015 FINAL  Final  MRSA PCR Screening     Status: None   Collection Time: 10/24/15  1:00 AM  Result Value Ref Range Status   MRSA by PCR NEGATIVE NEGATIVE Final    Comment:        The GeneXpert MRSA Assay (FDA approved for NASAL specimens only), is one component of a comprehensive MRSA colonization surveillance program. It is not intended to diagnose MRSA infection nor to guide or monitor treatment for MRSA infections.      Labs: Basic Metabolic Panel:  Recent Labs Lab 10/23/15 2015  10/24/15 0316 10/24/15 1207 10/25/15 0233 10/26/15 0332 10/27/15 0235 10/28/15 0645  NA  --   < > 133*  --  138 141 144 144  K  --   < > 3.2* 3.7 3.7 4.9 4.4 4.0  CL  --   < > 93*  --  102 111 111 115*  CO2  --   --   25  --  27 18* 22 20*  GLUCOSE  --   < > 156*  --  96 86 101* 91  BUN  --   < > 74*  --  42* 32* 31* 23*  CREATININE  --   < > 2.70*  --  1.84* 1.35* 1.39* 1.13  CALCIUM  --   --  8.1*  --  7.9* 8.1* 8.6* 8.3*  MG 1.9  --   --   --  1.9  --  1.7  --   PHOS 4.3  --   --   --   --   --   --   --   < > = values in this interval not displayed. Liver Function Tests:  Recent Labs Lab 10/23/15 1628 10/24/15 0316 10/25/15 0233 10/26/15 0332 10/27/15 0235  AST 25 17 13* 23 29  ALT 12* 11* 9* 8* 18  ALKPHOS 75 54 43 49 51  BILITOT 2.2* 1.6* 1.9* 2.0* 1.1  PROT 7.2 5.6* 5.6* 5.6* 6.0*  ALBUMIN 4.1 3.1* 2.7* 2.9* 2.9*    Recent Labs Lab 10/23/15 1628  LIPASE 25   No results for input(s): AMMONIA in the last 168 hours. CBC:  Recent Labs Lab 10/23/15 1421 10/23/15 1628 10/23/15 2024 10/24/15 0316 10/25/15 0233 10/28/15 0645  WBC 20.2* 19.6*  --  12.6* 6.5 9.0  NEUTROABS  --   --   --  10.7* 5.0  --   HGB 17.7 17.7* 17.3* 14.3 13.2 14.5  HCT 50.4 51.6 51.0 41.7 40.1 43.6  MCV 91.6 91.3  --  90.7 92.4 94.2  PLT  --  228  --  188 188 214   Cardiac Enzymes:  Recent Labs Lab 10/23/15 2015 10/24/15 0316  TROPONINI 0.06* 0.06*   BNP: BNP (last 3 results) No results for input(s): BNP in the last 8760 hours.  ProBNP (last 3 results) No results for input(s): PROBNP in the last 8760 hours.  CBG: No results for input(s): GLUCAP in the last 168 hours.     SignedRhetta Mura MD   [facility] 10/28/2015, 11:44 AM

## 2015-10-28 NOTE — Clinical Social Work Note (Signed)
Clinical Social Work Assessment  Patient Details  Name: Jesse Barber MRN: [id number] Date of Birth: 07-10-1929  Date of referral:  10/28/15               Reason for consult:  Facility Placement                Permission sought to share information with:  Facility Sport and exercise psychologist, Family Supports Permission granted to share information::  No (Patient doesn't speak [place]; completed assessment with son;)  Name::     Jesse Barber::  [facility] SNFs  Relationship::  Son  Contact Information:  [phone]  Housing/Transportation Living arrangements for the past 2 months:  [place] of Information:  Adult Children Patient Interpreter Needed:   [place]) Criminal Activity/Legal Involvement Pertinent to Current Situation/Hospitalization:  No - Comment as needed Significant Relationships:  Adult Children Lives with:  Adult Children Do you feel safe going back to the place where you live?  No Need for family participation in patient care:  Yes (Comment)  Care giving concerns:  CSW received referral for possible SNF placement at time of discharge. CSW met with patient and patient's son at bedside regarding PT recommendation of SNF placement at time of discharge. Per patient's son, patient's son is currently unable to care for patient at their home given patient's current physical needs and fall risk. Patient and patient's son expressed understanding of PT recommendation and are agreeable to SNF placement at time of discharge. CSW to continue to follow and assist with discharge planning needs.   Social Worker assessment / plan:  CSW spoke with patient and patient's son concerning possibility of rehab at [facility] before returning home.  Employment status:  Retired Forensic scientist:    PT Recommendations:  [place] / Referral to community resources:  [place]  Patient/Family's Response to care:  Patient and patient's  son recognize need for rehab before returning home and are agreeable to a SNF in [place].   Patient/Family's Understanding of and Emotional Response to Diagnosis, Current Treatment, and Prognosis:  Patient is realistic regarding therapy needs. No questions/concerns about plan or treatment.    Emotional Assessment Appearance:  Appears stated age Attitude/Demeanor/Rapport:   (Appropriate) Affect (typically observed):  Accepting Orientation:  Oriented to Self, Oriented to Place, Oriented to  Time, Oriented to Situation Alcohol / Substance use:  Not Applicable Psych involvement (Current and /or in the community):  No (Comment)  Discharge Needs  Concerns to be addressed:  Care Coordination Readmission within the last 30 days:  No Current discharge risk:  None Barriers to Discharge:  No Barriers Identified   Benard Halsted, [place] 10/28/2015, 4:14 PM

## 2015-10-28 NOTE — Progress Notes (Signed)
Patient will DC to: [facility] Anticipated DC date: 10/28/15 Family notified: Son Transport by: Sharin MonsPTAR [phone]  CSW signing off.  Cristobal GoldmannNadia Kyllian Clingerman, [place] Clinical Social Worker [phone]

## 2015-10-28 NOTE — [place] FL2 (Signed)
[place] MEDICAID FL2 LEVEL OF CARE SCREENING TOOL     IDENTIFICATION  Patient Name: Jesse Barber Birthdate: 18-Feb-1929 Sex: male Admission Date (Current Location): 10/23/2015  [facility] [place]   Facility and Address:  [facility] [place], [address], [place], [place] [address]      Provider Number: [id number]  Attending Physician Name and Address:  Rhetta MuraJai-Gurmukh Samtani, MD  Relative Name and Phone Number:  Marchelle GearingMiodrag, son, [phone]    Current Level of Care: Hospital Recommended Level of Care: Skilled Nursing Facility Prior Approval Number:    Date Approved/Denied:   PASRR Number: [id number]  Discharge Plan: SNF    Current Diagnoses: Patient Active Problem List   Diagnosis Date Noted  . Abnormal EKG 10/25/2015  . Atrial fibrillation with rapid ventricular response (HCC) 10/25/2015  . Elevated troponin 10/25/2015  . Chronic cor pulmonale (HCC)   . Dementia 10/23/2015  . Urinary frequency 10/23/2015  . Insomnia 10/23/2015  . Smoker 10/23/2015  . Language barrier to communication 10/23/2015  . SBO (small bowel obstruction) (HCC) 10/23/2015  . COPD (chronic obstructive pulmonary disease) (HCC) 10/23/2015  . AKI (acute kidney injury) (HCC) 10/23/2015  . Nausea and vomiting in adult 10/23/2015  . Tobacco abuse disorder 10/23/2015  . AAA (abdominal aortic aneurysm) without rupture (HCC) 10/23/2015    Orientation RESPIRATION BLADDER Height & Weight     Self, Time, Situation, Place  Normal Continent, External catheter (External urinary catheter) Weight: 56.5 kg (124 lb 9 oz) Height:  5\' 6"  (167.6 cm)  BEHAVIORAL SYMPTOMS/MOOD NEUROLOGICAL BOWEL NUTRITION STATUS      Incontinent  (Please see DC summary)  AMBULATORY STATUS COMMUNICATION OF NEEDS Skin   Limited Assist Verbally Normal                       Personal Care Assistance Level of Assistance  Bathing, Feeding, Dressing Bathing Assistance: Limited  assistance Feeding assistance: Limited assistance Dressing Assistance: Limited assistance     Functional Limitations Info             SPECIAL CARE FACTORS FREQUENCY  PT (By licensed PT)     PT Frequency: not yet assessed              Contractures      Additional Factors Info  Code Status, Allergies Code Status Info: Full Allergies Info: NKA           Current Medications (10/28/2015):  This is the current hospital active medication list Current Facility-Administered Medications  Medication Dose Route Frequency Provider Last Rate Last Dose  . amitriptyline (ELAVIL) tablet 25 mg  25 mg Oral QHS Leroy SeaPrashant K Singh, MD   25 mg at 10/27/15 2139  . apixaban (ELIQUIS) tablet 2.5 mg  2.5 mg Oral BID Leroy SeaPrashant K Singh, MD   2.5 mg at 10/28/15 0952  . aspirin chewable tablet 81 mg  81 mg Oral Daily Leroy SeaPrashant K Singh, MD   81 mg at 10/28/15 [id number]  . darifenacin (ENABLEX) 24 hr tablet 7.5 mg  7.5 mg Oral Daily Leroy SeaPrashant K Singh, MD   7.5 mg at 10/28/15 0952  . donepezil (ARICEPT) tablet 5 mg  5 mg Oral QHS Leroy SeaPrashant K Singh, MD   5 mg at 10/27/15 2140  . metoprolol tartrate (LOPRESSOR) tablet 25 mg  25 mg Oral BID Leroy SeaPrashant K Singh, MD   25 mg at 10/28/15 0955  . ondansetron (ZOFRAN) injection 4 mg  4 mg Intravenous Q6H  PRN Bobette Mo, MD      . pantoprazole (PROTONIX) EC tablet 40 mg  40 mg Oral Daily Darl Householder Masters, RPH   40 mg at 10/27/15 1715     Discharge Medications: Please see discharge summary for a list of discharge medications.  Relevant Imaging Results:  Relevant Lab Results:   Additional Information SSN: [id number]  Mearl Latin, LCSWA

## 2015-10-28 NOTE — Progress Notes (Signed)
UR COMPLETED  

## 2015-10-28 NOTE — Evaluation (Signed)
Occupational Therapy Evaluation Patient Details Name: Jesse Barber MRN: [id number] DOB: 1928-10-22 Today's Date: 10/28/2015    History of Present Illness Pt adm with SBO and acute kidney injury. PMH - dementia, COPD, OA   Clinical Impression   Patient presenting with decreased ADL and functional mobility independence secondary to above. Patient independent  PTA. Patient currently functioning at an overall min to mod assist level for ADLs and functional mobility/ambulation. Patient will benefit from acute OT to increase overall independence in the areas of ADLs, functional mobility, and overall safety in order to safely discharge to venue listed below.     Follow Up Recommendations  SNF;Supervision/Assistance - 24 hour    Equipment Recommendations  Other (comment) (TBD)    Recommendations for Other Services  None at this time    Precautions / Restrictions Precautions Precautions: Fall Restrictions Weight Bearing Restrictions: No    Mobility Bed Mobility Overal bed mobility: Needs Assistance Bed Mobility: Supine to Sit     Supine to sit: Supervision Sit to supine: Min assist   General bed mobility comments: Supervision for safety  Transfers Overall transfer level: Needs assistance Equipment used: 1 person hand held assist Transfers: Sit to/from Stand Sit to Stand: Min assist General transfer comment: Assist to bring hips up and for balance. Pt with posterior lean    Balance Overall balance assessment: Needs assistance Sitting-balance support: No upper extremity supported;Feet supported Sitting balance-Leahy Scale: Good     Standing balance support: No upper extremity supported;During functional activity Standing balance-Leahy Scale: Poor Standing balance comment: UE support    ADL Overall ADL's : Needs assistance/impaired General ADL Comments: Pt overall min to mod assist with ADLs. No AD or AE used during eval. Pt ambulating with hand over hand assist with min  assist.     Vision Vision Assessment?: No apparent visual deficits          Pertinent Vitals/Pain Pain Assessment: Faces Faces Pain Scale: No hurt     Hand Dominance  (unsure)   Extremity/Trunk Assessment Upper Extremity Assessment Upper Extremity Assessment: Generalized weakness   Lower Extremity Assessment Lower Extremity Assessment: Defer to PT evaluation   Cervical / Trunk Assessment Cervical / Trunk Assessment: Normal   Communication Communication Communication: Prefers language other than [place];Interpreter utilized (son interpreted )   Cognition Arousal/Alertness: Awake/alert Behavior During Therapy: WFL for tasks assessed/performed Overall Cognitive Status: History of cognitive impairments - at baseline              Home Living Family/patient expects to be discharged to:: Private residence Living Arrangements: Children Available Help at Discharge: Family (son can't physically assist due to recent shoulder surgery) Type of Home: House Home Access: Level entry     Home Layout: One level (except sunken level living room)     Bathroom Shower/Tub: Producer, television/film/videoWalk-in shower   Bathroom Toilet: Standard     Home Equipment: None   Prior Functioning/Environment Level of Independence: Independent     OT Diagnosis: Generalized weakness   OT Problem List: Decreased strength;Decreased activity tolerance;Impaired balance (sitting and/or standing);Decreased safety awareness;Decreased cognition;Decreased knowledge of use of DME or AE;Decreased knowledge of precautions   OT Treatment/Interventions: Self-care/ADL training;Therapeutic exercise;Energy conservation;DME and/or AE instruction;Therapeutic activities;Patient/family education;Balance training    OT Goals(Current goals can be found in the care plan section) Acute Rehab OT Goals Patient Stated Goal: Son needs pt to be more independent with mobility OT Goal Formulation: With patient/family Time For Goal Achievement:  11/04/15 Potential to Achieve Goals: Good ADL Goals Pt  Will Perform Grooming: with supervision;standing Pt Will Perform Lower Body Bathing: with supervision;sit to/from stand Pt Will Perform Lower Body Dressing: with supervision;sit to/from stand Pt Will Transfer to Toilet: with supervision;bedside commode;ambulating Additional ADL Goal #1: Pt will be supervision with functional ambulation/mobility during ADL   OT Frequency: Min 2X/week   Barriers to D/C: Decreased caregiver support   End of Session Nurse Communication: Mobility status  Activity Tolerance: Patient tolerated treatment well Patient left: in chair;with call bell/phone within reach;with chair alarm set;with nursing/sitter in room;with family/visitor present   Time: 2956-2130 OT Time Calculation (min): 14 min Charges:  OT General Charges $OT Visit: 1 Procedure OT Evaluation $OT Eval Low Complexity: 1 Procedure  Edwin Cap , MS, OTR/L, CLT Pager: [phone]  10/28/2015, 4:13 PM

## 2015-10-29 DIAGNOSIS — M6281 Muscle weakness (generalized): Secondary | ICD-10-CM | POA: Diagnosis not present

## 2015-10-29 DIAGNOSIS — K5669 Other intestinal obstruction: Secondary | ICD-10-CM | POA: Diagnosis not present

## 2015-11-01 ENCOUNTER — Non-Acute Institutional Stay (SKILLED_NURSING_FACILITY): Payer: Medicare Other | Admitting: Internal Medicine

## 2015-11-01 ENCOUNTER — Encounter: Payer: Self-pay | Admitting: Internal Medicine

## 2015-11-01 DIAGNOSIS — I4891 Unspecified atrial fibrillation: Secondary | ICD-10-CM | POA: Diagnosis not present

## 2015-11-01 DIAGNOSIS — Z720 Tobacco use: Secondary | ICD-10-CM | POA: Diagnosis not present

## 2015-11-01 DIAGNOSIS — I714 Abdominal aortic aneurysm, without rupture, unspecified: Secondary | ICD-10-CM

## 2015-11-01 DIAGNOSIS — F329 Major depressive disorder, single episode, unspecified: Secondary | ICD-10-CM | POA: Diagnosis not present

## 2015-11-01 DIAGNOSIS — J449 Chronic obstructive pulmonary disease, unspecified: Secondary | ICD-10-CM | POA: Diagnosis not present

## 2015-11-01 DIAGNOSIS — R778 Other specified abnormalities of plasma proteins: Secondary | ICD-10-CM

## 2015-11-01 DIAGNOSIS — R7989 Other specified abnormal findings of blood chemistry: Secondary | ICD-10-CM | POA: Diagnosis not present

## 2015-11-01 DIAGNOSIS — F32A Depression, unspecified: Secondary | ICD-10-CM

## 2015-11-01 DIAGNOSIS — N179 Acute kidney failure, unspecified: Secondary | ICD-10-CM

## 2015-11-01 DIAGNOSIS — M6281 Muscle weakness (generalized): Secondary | ICD-10-CM | POA: Diagnosis not present

## 2015-11-01 DIAGNOSIS — K5669 Other intestinal obstruction: Secondary | ICD-10-CM

## 2015-11-01 DIAGNOSIS — F0390 Unspecified dementia without behavioral disturbance: Secondary | ICD-10-CM | POA: Diagnosis not present

## 2015-11-01 DIAGNOSIS — N3000 Acute cystitis without hematuria: Secondary | ICD-10-CM

## 2015-11-01 DIAGNOSIS — K56609 Unspecified intestinal obstruction, unspecified as to partial versus complete obstruction: Secondary | ICD-10-CM

## 2015-11-01 NOTE — Progress Notes (Signed)
Patient ID: Trek Kimball, male   DOB: [birth year], 82 y.o.   MRN: [id number] MRN: [id number] Name: Aniello Christopoulos  Sex: male Age: 82 y.o. DOB: January 23, 1929  PSC #: Ronni Rumble Facility/Room: 107 B Level Of Care: SNF Provider: Margit Hanks Emergency Contacts: Extended Emergency Contact Information Primary Emergency Contact: Derek Jack of [place] Home Phone: [phone] Mobile Phone: [phone] Relation: Son Secondary Emergency Contact: Nehemiah Massed [place] of [place] Home Phone: [phone] Work Phone: [phone] Mobile Phone: [phone] Relation: Other  Code Status:   Allergies: Review of patient's allergies indicates no known allergies.  Chief Complaint  Patient presents with  . New Admit To SNF    Admission to facility.     HPI: Patient is 82 y.o. male with past medical history of smoking, dementia, COPD, mild depression and osteoarthritis who was admitted c nausea vomiting and some abdominal discomfort -he was diagnosed with small bowel obstruction. He was admitted to [facility] from 3/11-16 where he was treated with NG tube and IVF with resolution. Hospital course was complicated by new onset AF with RVR and ARF and a UTI.Marland Kitchen Pt is admitted to SNF for generalized weakness and post-op care. While at SNF pt will be followed for dementia, tx with aricept, depression, tx with elavil and GERD, tx with protonix.  Past Medical History  Diagnosis Date  . Asthma   . Arthritis   . Depression   . Dementia   . Incontinence   . Abnormal EKG 10/25/2015  . Atrial fibrillation with rapid ventricular response (HCC) 10/25/2015  . Elevated troponin 10/25/2015  . COPD (chronic obstructive pulmonary disease) (HCC)   . SBO (small bowel obstruction) (HCC)   . AKI (acute kidney injury) (HCC)   . N&V (nausea and vomiting)   . Tobacco abuse disorder   . AAA (abdominal aortic aneurysm) (HCC)   . Chronic cor pulmonale (HCC)     History reviewed. No pertinent past  surgical history.    Medication List       This list is accurate as of: 11/01/15 11:59 PM.  Always use your most recent med list.               amitriptyline 25 MG tablet  Commonly known as:  ELAVIL  Take 25 mg by mouth at bedtime.     apixaban 2.5 MG Tabs tablet  Commonly known as:  ELIQUIS  Take 1 tablet (2.5 mg total) by mouth 2 (two) times daily.     aspirin 81 MG chewable tablet  Chew 1 tablet (81 mg total) by mouth daily.     Cholecalciferol 5000 units Tabs  Take 1 tablet by mouth daily.     donepezil 5 MG tablet  Commonly known as:  ARICEPT  Take 5 mg by mouth at bedtime.     metoprolol tartrate 25 MG tablet  Commonly known as:  LOPRESSOR  Take 1 tablet (25 mg total) by mouth 2 (two) times daily.     pantoprazole 40 MG tablet  Commonly known as:  PROTONIX  Take 1 tablet (40 mg total) by mouth daily.     solifenacin 10 MG tablet  Commonly known as:  VESICARE  Take by mouth daily.        Meds ordered this encounter  Medications  . Cholecalciferol 5000 units TABS    Sig: Take 1 tablet by mouth daily.     There is no immunization history on file for this patient.  Social History  Substance Use Topics  .  Smoking status: Current Every Day Smoker -- 1.00 packs/day for 60 years    Types: Cigarettes  . Smokeless tobacco: Never Used     Comment: Unlikely to quit.   . Alcohol Use: No     Comment: Quit drinking 2 years ago    Family history is +HD, ETOH abuse   Review of Systems  DATA OBTAINED: from nurse, son GENERAL:  no fevers, fatigue, appetite changes SKIN: No itching, rash or wounds EYES: No eye pain, redness, discharge EARS: No earache, tinnitus, change in hearing NOSE: No congestion, drainage or bleeding  MOUTH/THROAT: No mouth or tooth pain, No sore throat RESPIRATORY: No cough, wheezing, SOB CARDIAC: No chest pain, palpitations, lower extremity edema  GI: No abdominal pain, No N/V/D or constipation, No heartburn or reflux  GU: No  dysuria, frequency or urgency, or incontinence  MUSCULOSKELETAL: No unrelieved bone/joint pain NEUROLOGIC: No headache, dizziness or focal weakness PSYCHIATRIC: No c/o anxiety or sadness   Filed Vitals:   11/01/15 1011  BP: 136/78  Pulse: 74  Temp: 98.7 F (37.1 C)  Resp: 20    SpO2 Readings from Last 1 Encounters:  11/01/15 98%        Physical Exam  GENERAL APPEARANCE: Alert, min conversant,  No acute distress.  SKIN: No diaphoresis rash HEAD: Normocephalic, atraumatic  EYES: Conjunctiva/lids clear. Pupils round, reactive. EOMs intact.  EARS: External exam WNL, canals clear. Hearing grossly normal.  NOSE: No deformity or discharge.  MOUTH/THROAT: Lips w/o lesions  RESPIRATORY: Breathing is even, unlabored. Lung sounds are clear   CARDIOVASCULAR: Heart irreg no murmurs, rubs or gallops. No peripheral edema.   GASTROINTESTINAL: Abdomen is soft, non-tender, not distended w/ normal bowel sounds. GENITOURINARY: Bladder non tender, not distended  MUSCULOSKELETAL: No abnormal joints or musculature NEUROLOGIC:  Cranial nerves 2-12 grossly intact. Moves all extremities  PSYCHIATRIC: Mood and affect appropriate to situation with dementia, no behavioral issues  Patient Active Problem List   Diagnosis Date Noted  . UTI (urinary tract infection) 11/10/2015  . Depression 11/10/2015  . Abnormal EKG 10/25/2015  . Atrial fibrillation with rapid ventricular response (HCC) 10/25/2015  . Elevated troponin 10/25/2015  . Chronic cor pulmonale (HCC)   . Dementia without behavioral disturbance 10/23/2015  . Urinary frequency 10/23/2015  . Insomnia 10/23/2015  . Smoker 10/23/2015  . Language barrier to communication 10/23/2015  . SBO (small bowel obstruction) (HCC) 10/23/2015  . COPD (chronic obstructive pulmonary disease) (HCC) 10/23/2015  . AKI (acute kidney injury) (HCC) 10/23/2015  . Nausea and vomiting in adult 10/23/2015  . Tobacco abuse disorder 10/23/2015  . AAA (abdominal  aortic aneurysm) without rupture (HCC) 10/23/2015    CBC    Component Value Date/Time   WBC 9.0 10/28/2015 0645   WBC 20.2* 10/23/2015 1421   RBC 4.63 10/28/2015 0645   RBC 5.50 10/23/2015 1421   HGB 14.5 10/28/2015 0645   HGB 17.7 10/23/2015 1421   HCT 43.6 10/28/2015 0645   HCT 50.4 10/23/2015 1421   PLT 214 10/28/2015 0645   MCV 94.2 10/28/2015 0645   MCV 91.6 10/23/2015 1421   LYMPHSABS 0.8 10/25/2015 0233   MONOABS 0.6 10/25/2015 0233   EOSABS 0.1 10/25/2015 0233   BASOSABS 0.0 10/25/2015 0233    CMP     Component Value Date/Time   NA 144 10/28/2015 0645   K 4.0 10/28/2015 0645   CL 115* 10/28/2015 0645   CO2 20* 10/28/2015 0645   GLUCOSE 91 10/28/2015 0645   BUN 23* 10/28/2015 0645  CREATININE 1.13 10/28/2015 0645   CALCIUM 8.3* 10/28/2015 0645   PROT 6.0* 10/27/2015 0235   ALBUMIN 2.9* 10/27/2015 0235   AST 29 10/27/2015 0235   ALT 18 10/27/2015 0235   ALKPHOS 51 10/27/2015 0235   BILITOT 1.1 10/27/2015 0235   GFRNONAA 57* 10/28/2015 0645   GFRAA >60 10/28/2015 0645    No results found for: HGBA1C   Ct Abdomen Pelvis Wo Contrast  10/23/2015  CLINICAL DATA:  Nausea and vomiting with generalized abdominal pain. EXAM: CT ABDOMEN AND PELVIS WITHOUT CONTRAST TECHNIQUE: Multidetector CT imaging of the abdomen and pelvis was performed following the standard protocol without IV contrast. COMPARISON:  Acute abdomen series from earlier today. FINDINGS: Lower chest: Emphysema noted in the lung bases with bronchial wall thickening and dependent atelectasis. Hepatobiliary: No focal abnormality in the liver on this study without intravenous contrast. No evidence of hepatomegaly. Gallbladder is distended. No intrahepatic or extrahepatic biliary dilation. Pancreas: No focal mass lesion. No dilatation of the main duct. No intraparenchymal cyst. No peripancreatic edema. Spleen: No splenomegaly. No focal mass lesion. Adrenals/Urinary Tract: No adrenal nodule or mass. No renal  stones or hydronephrosis. No definite renal mass lesion on this study without intravenous contrast material. No evidence for hydroureter. The urinary bladder appears normal for the degree of distention. Stomach/Bowel: Mild circumferential wall thickening is seen in the distal esophagus and there is a small hiatal hernia. Stomach otherwise unremarkable. Duodenum is normally positioned as is the ligament of Treitz. Proximal jejunal loops are opacified and not substantially dilated. However, mid small bowel is air-filled and dilated up to 4.2 cm in diameter. The distal ileal loops are completely decompressed and the terminal ileum is visible on image 41 series 21. No definite transition zone can be identified. The colon is largely decompressed throughout. No evidence for bowel wall thickening. No pneumatosis. Vascular/Lymphatic: Abdominal aortic atherosclerosis noted with infrarenal abdominal aorta measuring up to 3.4 cm in diameter. There is no gastrohepatic or hepatoduodenal ligament lymphadenopathy. No intraperitoneal or retroperitoneal lymphadenopathy. No pelvic sidewall lymphadenopathy. Reproductive: The prostate gland and seminal vesicles have normal imaging features. Other: No intraperitoneal free fluid. No evidence for interloop mesenteric fluid Musculoskeletal: Patient is status post ORIF for proximal left femur fracture. Bones are diffusely demineralized Bone windows reveal no worrisome lytic or sclerotic osseous lesions. IMPRESSION: 1. Dilated small bowel up to 4.2 cm compatible with small bowel obstruction. The point of obstruction is probably in the mid to distal small bowel although a discrete transition zone cannot be identified on the current exam. There is no associated pneumatosis or bowel wall thickening. No interloop mesenteric fluid. 2. Circumferential wall thickening in the distal esophagus associated with small hiatal hernia. Changes in the esophagus may be related and reflux. 3. 3.4 cm abdominal  aortic aneurysm. Recommend followup by ultrasound in 3 years. This recommendation follows ACR consensus guidelines: White Paper of the ACR Incidental Findings Committee II on Vascular Findings. [place] [place] Coll Radiol 2013; 10:789-794 Electronically Signed   By: Kennith Center M.D.   On: 10/23/2015 18:57   Dg Chest Port 1 View  10/23/2015  CLINICAL DATA:  Sepsis. EXAM: PORTABLE CHEST 1 VIEW COMPARISON:  Chest and abdominal radiographs earlier this day. Included lung bases from CT abdomen. FINDINGS: Lower lung volumes from prior exam. Developing ill-defined and linear bibasilar opacities. No pulmonary edema. No confluent airspace disease. No pleural effusion or pneumothorax. Dilated bowel loops in the right upper quadrant. IMPRESSION: Developing ill-defined linear bibasilar opacities, likely atelectasis. Given bowel  obstruction, aspiration could have a similar appearance. Electronically Signed   By: Rubye Oaks M.D.   On: 10/23/2015 23:59   Dg Abd Acute W/chest  10/23/2015  CLINICAL DATA:  82 year old male with history of abdominal pain. EXAM: DG ABDOMEN ACUTE W/ 1V CHEST COMPARISON:  No priors. FINDINGS: Several small dense nodules in the right hemithorax are most compatible with calcified granulomas. Bilateral nipple shadows incidentally noted. No acute consolidative airspace disease. No pleural effusions. No evidence of pulmonary edema. No pneumothorax. Heart size is normal. Upper mediastinal contours are within normal limits. Atherosclerosis in the thoracic aorta. Numerous dilated loops of small bowel measuring up to 6.3 cm in diameter. There is a small amount of colonic and rectal gas and stool noted. No frank pneumoperitoneum confidently identified. Orthopedic fixation hardware in the left hip incompletely visualize. Numerous vascular calcifications are noted. IMPRESSION: 1. Appearance of the abdomen is compatible with small bowel obstruction, as above. Given the presence of gas and stool in the colon,  obstruction may be early, or could be partial. 2. No pneumoperitoneum. 3. No radiographic evidence of acute cardiopulmonary disease. 4. Atherosclerosis. Electronically Signed   By: Trudie Reed M.D.   On: 10/23/2015 14:39   Dg Abd Portable 1v  10/24/2015  CLINICAL DATA:  Small bowel obstruction EXAM: PORTABLE ABDOMEN - 1 VIEW COMPARISON:  CT abdomen pelvis dated 10/23/2015 FINDINGS: Dilated loops of small bowel in the central abdomen, including opacified loops of small bowel in the left mid abdomen. Contrast has not yet reached the colon. Degenerative changes of the lumbar spine. Status post ORIF of the left hip IMPRESSION: Dilated loops of small bowel, as above, compatible with small bowel obstruction. Contrast has not yet reached the colon. Electronically Signed   By: Charline Bills M.D.   On: 10/24/2015 09:07   Dg Vangie Bicker G Tube Plc W/fl-no Rad  10/24/2015  CLINICAL DATA:  NASO G TUBE PLACEMENT WITH FLUORO Fluoroscopy was utilized by the requesting physician.  No radiographic interpretation.    Not all labs, radiology exams or other studies done during hospitalization come through on my EPIC note; however they are reviewed by me.    Assessment and Plan  SBO (small bowel obstruction) (HCC) SNF - resolved with NG and IVF; admitted for OT/PT  Atrial fibrillation with rapid ventricular response (HCC) [place] vasc 2 score =3. Was in RVR, placed on IV/PO digoxin along with low-dose beta blocker and as needed IV Lopressor, stable TSH , stable echogram with an EF of 60%, no wall motion abnormality or diastolic dysfunction, cardiology on board. Changed to PO metoprolol, digoxin d/c, start elliquis 2.5 bid Close OP f/u 3-4 wk for ? DCCV as per cardiology arrangements SNF - cont rate control with metoprolol and prophylaxis with eliquis 2.5 BID  AKI (acute kidney injury) (HCC) and hypokalemia due to emesis from SBO. Improving with hydration note we do not have a baseline creatinine, he might have  some element of CKD stage unknown.  SNF - will f/u with BMP  COPD (chronic obstructive pulmonary disease) (HCC) SNF - will monitor sx and start MDO or nebs as needed; smokinf cessation   AAA (abdominal aortic aneurysm) without rupture (HCC) 3.4 cm aneurysm diagnosed on CT scan. Outpatient vascular surgery follow-up and monitoring, have placed him on beta blocker .   Elevated troponin Chest pain-free, EKG nonacute, supportive care, place on aspirin and beta blocker, per cardiology will require a stress test in the next few days.  UTI (urinary tract infection) switched  from Zosyn to Rocephin. as cultures are neg, d/c Abx 3/15  Dementia without behavioral disturbance SNF - cont aricept 5 mg daily  Depression SNF - Cont elavil 25 mg noightl  Tobacco abuse disorder SNF - old [place] from [place], highly unlikely to stop smoking   Time spent > 45 min;> 50% of time with patient was spent reviewing records, labs, tests and studies, counseling and developing plan of care  Merrilee SeashoreAnne Shuntell Foody, MD

## 2015-11-02 DIAGNOSIS — K5669 Other intestinal obstruction: Secondary | ICD-10-CM | POA: Diagnosis not present

## 2015-11-02 DIAGNOSIS — M6281 Muscle weakness (generalized): Secondary | ICD-10-CM | POA: Diagnosis not present

## 2015-11-03 DIAGNOSIS — K5669 Other intestinal obstruction: Secondary | ICD-10-CM | POA: Diagnosis not present

## 2015-11-03 DIAGNOSIS — M6281 Muscle weakness (generalized): Secondary | ICD-10-CM | POA: Diagnosis not present

## 2015-11-04 DIAGNOSIS — K5669 Other intestinal obstruction: Secondary | ICD-10-CM | POA: Diagnosis not present

## 2015-11-04 DIAGNOSIS — M6281 Muscle weakness (generalized): Secondary | ICD-10-CM | POA: Diagnosis not present

## 2015-11-05 DIAGNOSIS — M6281 Muscle weakness (generalized): Secondary | ICD-10-CM | POA: Diagnosis not present

## 2015-11-05 DIAGNOSIS — K5669 Other intestinal obstruction: Secondary | ICD-10-CM | POA: Diagnosis not present

## 2015-11-08 DIAGNOSIS — K5669 Other intestinal obstruction: Secondary | ICD-10-CM | POA: Diagnosis not present

## 2015-11-08 DIAGNOSIS — M6281 Muscle weakness (generalized): Secondary | ICD-10-CM | POA: Diagnosis not present

## 2015-11-09 DIAGNOSIS — M6281 Muscle weakness (generalized): Secondary | ICD-10-CM | POA: Diagnosis not present

## 2015-11-09 DIAGNOSIS — K5669 Other intestinal obstruction: Secondary | ICD-10-CM | POA: Diagnosis not present

## 2015-11-10 DIAGNOSIS — M6281 Muscle weakness (generalized): Secondary | ICD-10-CM | POA: Diagnosis not present

## 2015-11-10 DIAGNOSIS — N390 Urinary tract infection, site not specified: Secondary | ICD-10-CM | POA: Insufficient documentation

## 2015-11-10 DIAGNOSIS — F32A Depression, unspecified: Secondary | ICD-10-CM | POA: Insufficient documentation

## 2015-11-10 DIAGNOSIS — F329 Major depressive disorder, single episode, unspecified: Secondary | ICD-10-CM | POA: Insufficient documentation

## 2015-11-10 DIAGNOSIS — K5669 Other intestinal obstruction: Secondary | ICD-10-CM | POA: Diagnosis not present

## 2015-11-10 NOTE — Assessment & Plan Note (Signed)
3.4 cm aneurysm diagnosed on CT scan. Outpatient vascular surgery follow-up and monitoring, have placed him on beta blocker .

## 2015-11-10 NOTE — Assessment & Plan Note (Signed)
SNF - resolved with NG and IVF; admitted for OT/PT

## 2015-11-10 NOTE — Assessment & Plan Note (Signed)
switched from Zosyn to Rocephin. as cultures are neg, d/c Abx 3/15

## 2015-11-10 NOTE — Assessment & Plan Note (Signed)
SNF - Cont elavil 25 mg noightl

## 2015-11-10 NOTE — Assessment & Plan Note (Signed)
and hypokalemia due to emesis from SBO. Improving with hydration note we do not have a baseline creatinine, he might have some element of CKD stage unknown.  SNF - will f/u with BMP

## 2015-11-10 NOTE — Assessment & Plan Note (Signed)
Chest pain-free, EKG nonacute, supportive care, place on aspirin and beta blocker, per cardiology will require a stress test in the next few days.

## 2015-11-10 NOTE — Assessment & Plan Note (Signed)
[place] vasc 2 score =3. Was in RVR, placed on IV/PO digoxin along with low-dose beta blocker and as needed IV Lopressor, stable TSH , stable echogram with an EF of 60%, no wall motion abnormality or diastolic dysfunction, cardiology on board. Changed to PO metoprolol, digoxin d/c, start elliquis 2.5 bid Close OP f/u 3-4 wk for ? DCCV as per cardiology arrangements SNF - cont rate control with metoprolol and prophylaxis with eliquis 2.5 BID

## 2015-11-10 NOTE — Assessment & Plan Note (Signed)
SNF - [place] highly unlikely to stop smoking

## 2015-11-10 NOTE — Assessment & Plan Note (Signed)
SNF - will monitor sx and start MDO or nebs as needed; smokinf cessation

## 2015-11-10 NOTE — Assessment & Plan Note (Signed)
SNF - cont aricept 5 mg daily

## 2015-11-11 DIAGNOSIS — M6281 Muscle weakness (generalized): Secondary | ICD-10-CM | POA: Diagnosis not present

## 2015-11-11 DIAGNOSIS — K5669 Other intestinal obstruction: Secondary | ICD-10-CM | POA: Diagnosis not present

## 2015-11-12 DIAGNOSIS — K5669 Other intestinal obstruction: Secondary | ICD-10-CM | POA: Diagnosis not present

## 2015-11-12 DIAGNOSIS — M6281 Muscle weakness (generalized): Secondary | ICD-10-CM | POA: Diagnosis not present

## 2015-11-15 DIAGNOSIS — M6281 Muscle weakness (generalized): Secondary | ICD-10-CM | POA: Diagnosis not present

## 2015-11-15 DIAGNOSIS — K5669 Other intestinal obstruction: Secondary | ICD-10-CM | POA: Diagnosis not present

## 2015-11-16 DIAGNOSIS — M6281 Muscle weakness (generalized): Secondary | ICD-10-CM | POA: Diagnosis not present

## 2015-11-16 DIAGNOSIS — K5669 Other intestinal obstruction: Secondary | ICD-10-CM | POA: Diagnosis not present

## 2015-11-17 DIAGNOSIS — K5669 Other intestinal obstruction: Secondary | ICD-10-CM | POA: Diagnosis not present

## 2015-11-17 DIAGNOSIS — M6281 Muscle weakness (generalized): Secondary | ICD-10-CM | POA: Diagnosis not present

## 2015-11-18 ENCOUNTER — Ambulatory Visit (INDEPENDENT_AMBULATORY_CARE_PROVIDER_SITE_OTHER): Payer: Medicare Other | Admitting: Cardiovascular Disease

## 2015-11-18 ENCOUNTER — Encounter: Payer: Self-pay | Admitting: Cardiovascular Disease

## 2015-11-18 VITALS — BP 90/60 | HR 64 | Ht 63.0 in | Wt 127.1 lb

## 2015-11-18 DIAGNOSIS — I481 Persistent atrial fibrillation: Secondary | ICD-10-CM

## 2015-11-18 DIAGNOSIS — I2781 Cor pulmonale (chronic): Secondary | ICD-10-CM | POA: Diagnosis not present

## 2015-11-18 DIAGNOSIS — R931 Abnormal findings on diagnostic imaging of heart and coronary circulation: Secondary | ICD-10-CM

## 2015-11-18 DIAGNOSIS — I4819 Other persistent atrial fibrillation: Secondary | ICD-10-CM

## 2015-11-18 DIAGNOSIS — M6281 Muscle weakness (generalized): Secondary | ICD-10-CM | POA: Diagnosis not present

## 2015-11-18 DIAGNOSIS — K5669 Other intestinal obstruction: Secondary | ICD-10-CM | POA: Diagnosis not present

## 2015-11-18 DIAGNOSIS — R9439 Abnormal result of other cardiovascular function study: Secondary | ICD-10-CM

## 2015-11-18 NOTE — Patient Instructions (Signed)
Dr Royann Shiversroitoru has recommended making the following medication changes: 1. STOP Aspirin  Dr Royann Shiversroitoru recommends that you schedule a follow-up appointment in 6 months. You will receive a reminder letter in the mail two months in advance. If you don't receive a letter, please call our office to schedule the follow-up appointment.  If you need a refill on your cardiac medications before your next appointment, please call your pharmacy.   Your physician recommends that you eat more.

## 2015-11-19 DIAGNOSIS — K5669 Other intestinal obstruction: Secondary | ICD-10-CM | POA: Diagnosis not present

## 2015-11-19 DIAGNOSIS — M6281 Muscle weakness (generalized): Secondary | ICD-10-CM | POA: Diagnosis not present

## 2015-11-19 NOTE — Progress Notes (Signed)
Patient ID: Jesse Barber, male   DOB: August 18, 1928, 82 y.o.   MRN: [id number]    Cardiology Office Note    Date:  11/19/2015   ID:  Jesse Barber, DOB August 18, 1928, MRN [id number]  PCP:  Margit HanksALEXANDER, ANNE D, MD  Cardiologist:   Thurmon FairROITORU,Damek Ende, MD   Chief Complaint  Patient presents with  . Hospitalization Follow-up    no chest pain, no shortness of breath, no edema, no pain or cramping in legs, no lightheaded or dizziness    History of Present Illness:  Jesse HamburgerJagos Crilly is a 82 y.o. male recent diagnosed with atrial fibrillation during a hospitalization for small bowel obstruction and acute renal insufficiency. He also has some evidence of dementia and depression. He is currently in rehabilitation at [facility]. He has no cardiac complaints. He is unaware of palpitations. His dyspnea is at baseline. He denies angina, focal neurological events, bleeding problems, leg edema. The biggest problem appears to be his very poor appetite. He has lost substantial weight. His blood pressure is relatively low today but he denies dizziness, lightheadedness, syncope or fatigue.  During his recent hospitalization ventricular rate control was achieved fairly easily once we were able to administer oral medications. His echocardiogram showed normal left ventricular size and systolic function but showed moderate dilation of the right heart chambers and moderate pulmonary hypertension, likely COPD-related cor pulmonale. His acute renal failure resolved. Today he remains in atrial fibrillation and is oblivious to the arrhythmia.  He had a nuclear stress test performed during the same hospitalization that showed inferior/inferoseptal wall scar and ejection fraction of 48%. There was no clear evidence of reversible ischemia, although there was a small equivocal abnormality at the level of the apex.  Mild elevation in cardiac troponin during his hospitalization was likely related to acute illness and rapid ventricular response due  to atrial fibrillation. There was no ECG evidence of ischemic changes and no wall motion abnormalities were seen on echocardiography.  He is accompanied by [name], his son, who provides translation.  Past Medical History  Diagnosis Date  . Asthma   . Arthritis   . Depression   . Dementia   . Incontinence   . Abnormal EKG 10/25/2015  . Atrial fibrillation with rapid ventricular response (HCC) 10/25/2015  . Elevated troponin 10/25/2015  . COPD (chronic obstructive pulmonary disease) (HCC)   . SBO (small bowel obstruction) (HCC)   . AKI (acute kidney injury) (HCC)   . N&V (nausea and vomiting)   . Tobacco abuse disorder   . AAA (abdominal aortic aneurysm) (HCC)   . Chronic cor pulmonale (HCC)     No past surgical history on file.  Current Medications: Outpatient Prescriptions Prior to Visit  Medication Sig Dispense Refill  . amitriptyline (ELAVIL) 25 MG tablet Take 25 mg by mouth at bedtime.    Marland Kitchen. apixaban (ELIQUIS) 2.5 MG TABS tablet Take 1 tablet (2.5 mg total) by mouth 2 (two) times daily. 60 tablet 0  . aspirin 81 MG chewable tablet Chew 1 tablet (81 mg total) by mouth daily. 30 tablet 0  . Cholecalciferol 5000 units TABS Take 1 tablet by mouth daily.    Marland Kitchen. donepezil (ARICEPT) 5 MG tablet Take 5 mg by mouth at bedtime.    . metoprolol tartrate (LOPRESSOR) 25 MG tablet Take 1 tablet (25 mg total) by mouth 2 (two) times daily. 60 tablet 0  . pantoprazole (PROTONIX) 40 MG tablet Take 1 tablet (40 mg total) by mouth daily. 30 tablet 0  .  solifenacin (VESICARE) 10 MG tablet Take by mouth daily.     No facility-administered medications prior to visit.     Allergies:   Review of patient's allergies indicates no known allergies.   Social History   Social History  . Marital Status: Single    Spouse Name: N/A  . Number of Children: N/A  . Years of Education: N/A   Occupational History  . retired    Social History Main Topics  . Smoking status: Current Every Day Smoker -- 1.00  packs/day for 60 years    Types: Cigarettes  . Smokeless tobacco: Never Used     Comment: Unlikely to quit.   . Alcohol Use: No     Comment: Quit drinking 2 years ago  . Drug Use: No  . Sexual Activity: No   Other Topics Concern  . None   Social History Narrative   Originally from [place]   Came to the [place] in 2015 and lives with his son and daughter-in-law.     Family History:  The patient's Significantly negative for coronary disease or other cardiac illnesses.  ROS:   Please see the history of present illness.    ROS All other systems reviewed and are negative.   PHYSICAL EXAM:   VS:  BP 90/60 mmHg  Pulse 64  Ht  (1.6 m)  Wt 57.664 kg (127 lb 2 oz)  BMI 22.53 kg/m2   GEN: Well nourished, well developed, in no acute distress HEENT: normal Neck: no JVD, carotid bruits, or masses Cardiac: Irregular rhythm, normal S1 and S2; no murmurs, rubs, or gallops,no edema  Respiratory:  clear to auscultation bilaterally, normal work of breathing GI: soft, nontender, nondistended, + BS MS: no deformity or atrophy Skin: warm and dry, no rash Neuro:  Alert and Oriented x 3, Strength and sensation are intact Psych: euthymic mood, full affect  Wt Readings from Last 3 Encounters:  11/18/15 57.664 kg (127 lb 2 oz)  11/01/15 54.432 kg (120 lb)  10/23/15 56.5 kg (124 lb 9 oz)      Studies/Labs Reviewed:   EKG:  EKG is ordered today.  The ekg ordered today demonstrates Atrial fibrillation, QS pattern in leads V1-V3, minor nonspecific ST segment changes in the inferior and lateral leads, no acute changes seen during his hospitalization  Recent Labs: 10/24/2015: TSH 2.622 10/27/2015: ALT 18; Magnesium 1.7 10/28/2015: BUN 23*; Creatinine, Ser 1.13; Hemoglobin 14.5; Platelets 214; Potassium 4.0; Sodium 144   Lipid Panel    Component Value Date/Time   CHOL 151 10/26/2015 0332   TRIG 120 10/26/2015 0332   HDL 32* 10/26/2015 0332   CHOLHDL 4.7 10/26/2015 0332   VLDL 24 10/26/2015  0332   LDLCALC 95 10/26/2015 0332    ASSESSMENT:    1. Persistent atrial fibrillation (HCC)   2. Chronic cor pulmonale (HCC)   3. Abnormal nuclear stress test      PLAN:  In order of problems listed above:  1. AFib: It seems quite likely that this is actually a long-standing chronic arrhythmia that only came to light during his recent hospitalization for small bowel obstruction. Rate control is excellent and he is tolerating anticoagulation without any bleeding problems. CHADSVasc at least 54 (age, PAD). The current dose of beta blocker is not interfering with his breathing problems. He has normal left ventricular systolic and diastolic parameters and had mild-moderate valvular abnormalities, that are likely not related to the atrial fibrillation. 2. COPD/cor pulmonale: He had a dilated right ventricle  and right atrium and moderate pulmonary hypertension by echocardiography. This is likely the underlying substrate for his arrhythmia. He has not been smoking for last 3 weeks and hopefully will not restart smoking once he gets home. He has clear findings of emphysema on physical exam. 3. Probable CAD: The absence of angina pectoris and with the findings on his recent nuclear stress test I would not recommend further coronary evaluation. In fact I think it is safer for him to stop aspirin to avoid the risk of bleeding while on treatment with a direct oral anticoagulant. He should continue taking the beta blocker as long as it does not cause symptomatic hypotension. We should reevaluate his lipid profile in a few months. While in the hospital, LDL cholesterol was only 95, but this may have been artificially low due to his acute illness. His HDL was also low at 32. He is very lean and is unlikely he will ever become more active.    Medication Adjustments/Labs and Tests Ordered: Current medicines are reviewed at length with the patient today.  Concerns regarding medicines are outlined above.   Medication changes, Labs and Tests ordered today are listed in the Patient Instructions below. Patient Instructions  Dr Royann Shivers has recommended making the following medication changes: 1. STOP Aspirin  Dr Royann Shivers recommends that you schedule a follow-up appointment in 6 months. You will receive a reminder letter in the mail two months in advance. If you don't receive a letter, please call our office to schedule the follow-up appointment.  If you need a refill on your cardiac medications before your next appointment, please call your pharmacy.   Your physician recommends that you eat more.     Joie Bimler, MD  11/19/2015 12:36 PM    [facility] [address] [place], [place], [place]  [address] Phone: [phone]; Fax: [phone]

## 2015-11-22 DIAGNOSIS — K5669 Other intestinal obstruction: Secondary | ICD-10-CM | POA: Diagnosis not present

## 2015-11-22 DIAGNOSIS — M6281 Muscle weakness (generalized): Secondary | ICD-10-CM | POA: Diagnosis not present

## 2015-11-23 DIAGNOSIS — K5669 Other intestinal obstruction: Secondary | ICD-10-CM | POA: Diagnosis not present

## 2015-11-23 DIAGNOSIS — M6281 Muscle weakness (generalized): Secondary | ICD-10-CM | POA: Diagnosis not present

## 2015-11-24 DIAGNOSIS — K5669 Other intestinal obstruction: Secondary | ICD-10-CM | POA: Diagnosis not present

## 2015-11-24 DIAGNOSIS — M6281 Muscle weakness (generalized): Secondary | ICD-10-CM | POA: Diagnosis not present

## 2015-11-25 DIAGNOSIS — M6281 Muscle weakness (generalized): Secondary | ICD-10-CM | POA: Diagnosis not present

## 2015-11-25 DIAGNOSIS — K5669 Other intestinal obstruction: Secondary | ICD-10-CM | POA: Diagnosis not present

## 2015-11-26 DIAGNOSIS — K5669 Other intestinal obstruction: Secondary | ICD-10-CM | POA: Diagnosis not present

## 2015-11-26 DIAGNOSIS — M6281 Muscle weakness (generalized): Secondary | ICD-10-CM | POA: Diagnosis not present

## 2015-11-29 DIAGNOSIS — K5669 Other intestinal obstruction: Secondary | ICD-10-CM | POA: Diagnosis not present

## 2015-11-29 DIAGNOSIS — M6281 Muscle weakness (generalized): Secondary | ICD-10-CM | POA: Diagnosis not present

## 2015-11-30 DIAGNOSIS — M6281 Muscle weakness (generalized): Secondary | ICD-10-CM | POA: Diagnosis not present

## 2015-11-30 DIAGNOSIS — K5669 Other intestinal obstruction: Secondary | ICD-10-CM | POA: Diagnosis not present

## 2015-12-01 DIAGNOSIS — M6281 Muscle weakness (generalized): Secondary | ICD-10-CM | POA: Diagnosis not present

## 2015-12-01 DIAGNOSIS — K5669 Other intestinal obstruction: Secondary | ICD-10-CM | POA: Diagnosis not present

## 2015-12-02 ENCOUNTER — Non-Acute Institutional Stay (SKILLED_NURSING_FACILITY): Payer: Medicare Other | Admitting: Internal Medicine

## 2015-12-02 DIAGNOSIS — F329 Major depressive disorder, single episode, unspecified: Secondary | ICD-10-CM

## 2015-12-02 DIAGNOSIS — K5669 Other intestinal obstruction: Secondary | ICD-10-CM | POA: Diagnosis not present

## 2015-12-02 DIAGNOSIS — I482 Chronic atrial fibrillation, unspecified: Secondary | ICD-10-CM

## 2015-12-02 DIAGNOSIS — M6281 Muscle weakness (generalized): Secondary | ICD-10-CM | POA: Diagnosis not present

## 2015-12-02 DIAGNOSIS — F32A Depression, unspecified: Secondary | ICD-10-CM

## 2015-12-02 DIAGNOSIS — F0390 Unspecified dementia without behavioral disturbance: Secondary | ICD-10-CM | POA: Diagnosis not present

## 2015-12-02 NOTE — Progress Notes (Signed)
MRN: [id number] Name: Jesse Barber  Sex: male Age: 82 y.o. DOB: Dec 01, 1928  [facility] #: [email or web address] Facility/Room:107 Level Of Care: SNF Provider: Merrilee Seashore D Emergency Contacts: Extended Emergency Contact Information Primary Emergency Contact: Derek Jack of [place] Home Phone: [phone] Mobile Phone: [phone] Relation: Son Secondary Emergency Contact: Nehemiah Massed [place] of [place] Home Phone: [phone] Work Phone: [phone] Mobile Phone: [phone] Relation: Other  Code Status:   Allergies: Review of patient's allergies indicates no known allergies.  Chief Complaint  Patient presents with  . Medical Management of Chronic Issues    HPI: Patient is 82 y.o. male with  past medical history of smoking, dementia, COPD, mild depression and osteoarthritis who was admitted c nausea vomiting and some abdominal discomfort -he was diagnosed with small bowel obstruction. He was admitted to [facility] from 3/11-16 where he was treated with NG tube and IVF with resolution. Hospital course was complicated by new onset AF with RVR and ARF and a UTI.Marland Kitchen Pt is admitted to SNF for generalized weakness and post-op care. Pt is being seen today for routine issues of AF, dementia and depression.  Past Medical History  Diagnosis Date  . Asthma   . Arthritis   . Depression   . Dementia   . Incontinence   . Abnormal EKG 10/25/2015  . Atrial fibrillation with rapid ventricular response (HCC) 10/25/2015  . Elevated troponin 10/25/2015  . COPD (chronic obstructive pulmonary disease) (HCC)   . SBO (small bowel obstruction) (HCC)   . AKI (acute kidney injury) (HCC)   . N&V (nausea and vomiting)   . Tobacco abuse disorder   . AAA (abdominal aortic aneurysm) (HCC)   . Chronic cor pulmonale (HCC)     No past surgical history on file.    Medication List       This list is accurate as of: 12/02/15 11:59 PM.  Always use your most recent med list.                amitriptyline 25 MG tablet  Commonly known as:  ELAVIL  Take 25 mg by mouth at bedtime.     apixaban 2.5 MG Tabs tablet  Commonly known as:  ELIQUIS  Take 1 tablet (2.5 mg total) by mouth 2 (two) times daily.     aspirin 81 MG chewable tablet  Chew 1 tablet (81 mg total) by mouth daily.     Cholecalciferol 5000 units Tabs  Take 1 tablet by mouth daily.     donepezil 5 MG tablet  Commonly known as:  ARICEPT  Take 5 mg by mouth at bedtime.     metoprolol tartrate 25 MG tablet  Commonly known as:  LOPRESSOR  Take 1 tablet (25 mg total) by mouth 2 (two) times daily.     pantoprazole 40 MG tablet  Commonly known as:  PROTONIX  Take 1 tablet (40 mg total) by mouth daily.     solifenacin 10 MG tablet  Commonly known as:  VESICARE  Take by mouth daily.        No orders of the defined types were placed in this encounter.     There is no immunization history on file for this patient.  Social History  Substance Use Topics  . Smoking status: Current Every Day Smoker -- 1.00 packs/day for 60 years    Types: Cigarettes  . Smokeless tobacco: Never Used     Comment: Unlikely to quit.   . Alcohol Use: No  Comment: Quit drinking 2 years ago    Review of Systems  DATA OBTAINED: from nurse GENERAL:  no fevers, fatigue, appetite changes SKIN: No itching, rash HEENT: No complaint RESPIRATORY: No cough, wheezing, SOB CARDIAC: No chest pain, palpitations, lower extremity edema  GI: No abdominal pain, No N/V/D or constipation, No heartburn or reflux  GU: No dysuria, frequency or urgency, or incontinence  MUSCULOSKELETAL: No unrelieved bone/joint pain NEUROLOGIC: No headache, dizziness  PSYCHIATRIC: No overt anxiety or sadness  Filed Vitals:   12/11/15 1215  BP: 123/77  Pulse: 64  Temp: 98 F (36.7 C)  Resp: 20    Physical Exam  GENERAL APPEARANCE: Alert, conversant, No acute distress,   SKIN: No diaphoresis rash HEENT: Unremarkable RESPIRATORY: Breathing is  even, unlabored. Lung sounds are clear   CARDIOVASCULAR: Heart irreg no murmurs, rubs or gallops. No peripheral edema  GASTROINTESTINAL: Abdomen is soft, non-tender, not distended w/ normal bowel sounds.  GENITOURINARY: Bladder non tender, not distended  MUSCULOSKELETAL: No abnormal joints or musculature NEUROLOGIC: Cranial nerves 2-12 grossly intact. Moves all extremities PSYCHIATRIC: Mood and affect appropriate to situation, no behavioral issues  Patient Active Problem List   Diagnosis Date Noted  . Chronic atrial fibrillation (HCC) 12/11/2015  . UTI (urinary tract infection) 11/10/2015  . Depression 11/10/2015  . Abnormal EKG 10/25/2015  . Atrial fibrillation with rapid ventricular response (HCC) 10/25/2015  . Elevated troponin 10/25/2015  . Chronic cor pulmonale (HCC)   . Dementia without behavioral disturbance 10/23/2015  . Urinary frequency 10/23/2015  . Insomnia 10/23/2015  . Smoker 10/23/2015  . Language barrier to communication 10/23/2015  . SBO (small bowel obstruction) (HCC) 10/23/2015  . COPD (chronic obstructive pulmonary disease) (HCC) 10/23/2015  . AKI (acute kidney injury) (HCC) 10/23/2015  . Nausea and vomiting in adult 10/23/2015  . Tobacco abuse disorder 10/23/2015  . AAA (abdominal aortic aneurysm) without rupture (HCC) 10/23/2015    CBC    Component Value Date/Time   WBC 9.0 10/28/2015 0645   WBC 20.2* 10/23/2015 1421   RBC 4.63 10/28/2015 0645   RBC 5.50 10/23/2015 1421   HGB 14.5 10/28/2015 0645   HGB 17.7 10/23/2015 1421   HCT 43.6 10/28/2015 0645   HCT 50.4 10/23/2015 1421   PLT 214 10/28/2015 0645   MCV 94.2 10/28/2015 0645   MCV 91.6 10/23/2015 1421   LYMPHSABS 0.8 10/25/2015 0233   MONOABS 0.6 10/25/2015 0233   EOSABS 0.1 10/25/2015 0233   BASOSABS 0.0 10/25/2015 0233    CMP     Component Value Date/Time   NA 144 10/28/2015 0645   K 4.0 10/28/2015 0645   CL 115* 10/28/2015 0645   CO2 20* 10/28/2015 0645   GLUCOSE 91 10/28/2015 0645    BUN 23* 10/28/2015 0645   CREATININE 1.13 10/28/2015 0645   CALCIUM 8.3* 10/28/2015 0645   PROT 6.0* 10/27/2015 0235   ALBUMIN 2.9* 10/27/2015 0235   AST 29 10/27/2015 0235   ALT 18 10/27/2015 0235   ALKPHOS 51 10/27/2015 0235   BILITOT 1.1 10/27/2015 0235   GFRNONAA 57* 10/28/2015 0645   GFRAA >60 10/28/2015 0645    Assessment and Plan  Chronic atrial fibrillation (HCC) Pt was seen by cardiology earlier this month and rec pt be off ASA while on Eliquis;plan - will d/c ASA; cont metoprolol  Dementia without behavioral disturbance Pt isvery stable; cont aricpet 5 mg daily  Depression Pt is very stable and appears content; cont eleavil 25 mg q HS    Chealsey Miyamoto  D, MD

## 2015-12-03 DIAGNOSIS — M6281 Muscle weakness (generalized): Secondary | ICD-10-CM | POA: Diagnosis not present

## 2015-12-03 DIAGNOSIS — K5669 Other intestinal obstruction: Secondary | ICD-10-CM | POA: Diagnosis not present

## 2015-12-06 DIAGNOSIS — M6281 Muscle weakness (generalized): Secondary | ICD-10-CM | POA: Diagnosis not present

## 2015-12-06 DIAGNOSIS — K5669 Other intestinal obstruction: Secondary | ICD-10-CM | POA: Diagnosis not present

## 2015-12-07 DIAGNOSIS — K5669 Other intestinal obstruction: Secondary | ICD-10-CM | POA: Diagnosis not present

## 2015-12-07 DIAGNOSIS — M6281 Muscle weakness (generalized): Secondary | ICD-10-CM | POA: Diagnosis not present

## 2015-12-08 DIAGNOSIS — M6281 Muscle weakness (generalized): Secondary | ICD-10-CM | POA: Diagnosis not present

## 2015-12-08 DIAGNOSIS — K5669 Other intestinal obstruction: Secondary | ICD-10-CM | POA: Diagnosis not present

## 2015-12-09 DIAGNOSIS — M6281 Muscle weakness (generalized): Secondary | ICD-10-CM | POA: Diagnosis not present

## 2015-12-09 DIAGNOSIS — K5669 Other intestinal obstruction: Secondary | ICD-10-CM | POA: Diagnosis not present

## 2015-12-10 DIAGNOSIS — M6281 Muscle weakness (generalized): Secondary | ICD-10-CM | POA: Diagnosis not present

## 2015-12-10 DIAGNOSIS — K5669 Other intestinal obstruction: Secondary | ICD-10-CM | POA: Diagnosis not present

## 2015-12-11 ENCOUNTER — Encounter: Payer: Self-pay | Admitting: Internal Medicine

## 2015-12-11 DIAGNOSIS — I482 Chronic atrial fibrillation, unspecified: Secondary | ICD-10-CM | POA: Insufficient documentation

## 2015-12-11 NOTE — Assessment & Plan Note (Signed)
Pt was seen by cardiology earlier this month and rec pt be off ASA while on Eliquis;plan - will d/c ASA; cont metoprolol

## 2015-12-11 NOTE — Assessment & Plan Note (Signed)
Pt is very stable and appears content; cont eleavil 25 mg q HS

## 2015-12-11 NOTE — Assessment & Plan Note (Signed)
Pt isvery stable; cont aricpet 5 mg daily

## 2015-12-13 DIAGNOSIS — M6281 Muscle weakness (generalized): Secondary | ICD-10-CM | POA: Diagnosis not present

## 2015-12-13 DIAGNOSIS — K5669 Other intestinal obstruction: Secondary | ICD-10-CM | POA: Diagnosis not present

## 2015-12-14 DIAGNOSIS — M6281 Muscle weakness (generalized): Secondary | ICD-10-CM | POA: Diagnosis not present

## 2015-12-14 DIAGNOSIS — K5669 Other intestinal obstruction: Secondary | ICD-10-CM | POA: Diagnosis not present

## 2015-12-15 DIAGNOSIS — K5669 Other intestinal obstruction: Secondary | ICD-10-CM | POA: Diagnosis not present

## 2015-12-15 DIAGNOSIS — M6281 Muscle weakness (generalized): Secondary | ICD-10-CM | POA: Diagnosis not present

## 2015-12-16 ENCOUNTER — Encounter: Payer: Self-pay | Admitting: Internal Medicine

## 2015-12-16 ENCOUNTER — Non-Acute Institutional Stay (SKILLED_NURSING_FACILITY): Payer: Medicare Other | Admitting: Internal Medicine

## 2015-12-16 DIAGNOSIS — F329 Major depressive disorder, single episode, unspecified: Secondary | ICD-10-CM

## 2015-12-16 DIAGNOSIS — J449 Chronic obstructive pulmonary disease, unspecified: Secondary | ICD-10-CM | POA: Diagnosis not present

## 2015-12-16 DIAGNOSIS — F32A Depression, unspecified: Secondary | ICD-10-CM

## 2015-12-16 DIAGNOSIS — Z720 Tobacco use: Secondary | ICD-10-CM | POA: Diagnosis not present

## 2015-12-16 DIAGNOSIS — I714 Abdominal aortic aneurysm, without rupture, unspecified: Secondary | ICD-10-CM

## 2015-12-16 DIAGNOSIS — F0390 Unspecified dementia without behavioral disturbance: Secondary | ICD-10-CM

## 2015-12-16 DIAGNOSIS — I482 Chronic atrial fibrillation, unspecified: Secondary | ICD-10-CM

## 2015-12-16 DIAGNOSIS — K5669 Other intestinal obstruction: Secondary | ICD-10-CM | POA: Diagnosis not present

## 2015-12-16 DIAGNOSIS — M6281 Muscle weakness (generalized): Secondary | ICD-10-CM | POA: Diagnosis not present

## 2015-12-16 DIAGNOSIS — I2781 Cor pulmonale (chronic): Secondary | ICD-10-CM | POA: Diagnosis not present

## 2015-12-16 DIAGNOSIS — N179 Acute kidney failure, unspecified: Secondary | ICD-10-CM

## 2015-12-16 DIAGNOSIS — Z789 Other specified health status: Secondary | ICD-10-CM

## 2015-12-16 DIAGNOSIS — K56609 Unspecified intestinal obstruction, unspecified as to partial versus complete obstruction: Secondary | ICD-10-CM

## 2015-12-16 DIAGNOSIS — R9431 Abnormal electrocardiogram [ECG] [EKG]: Secondary | ICD-10-CM

## 2015-12-16 NOTE — Progress Notes (Deleted)
MRN: [id number] Name: Jesse Barber  Sex: male Age: 82 y.o. DOB: [birth year]  PSC #: Ronni Rumble Facility/Room:107-B Level Of Care: SNF Provider:Alexander, Thurston Hole MD Emergency Contacts: Extended Emergency Contact Information Primary Emergency Contact: Soledad Gerlach [place] Home Phone: [phone] Mobile Phone: [phone] Relation: Son Secondary Emergency Contact: Nehemiah Massed [place] Home Phone: [phone] Work Phone: [phone] Mobile Phone: [phone] Relation: Other  Code Status:   Allergies: Review of patient's allergies indicates no known allergies.  Chief Complaint  Patient presents with  . Discharge Note    HPI: Patient is 82 y.o. male who  Past Medical History  Diagnosis Date  . Asthma   . Arthritis   . Depression   . Dementia   . Incontinence   . Abnormal EKG 10/25/2015  . Atrial fibrillation with rapid ventricular response (HCC) 10/25/2015  . Elevated troponin 10/25/2015  . COPD (chronic obstructive pulmonary disease) (HCC)   . SBO (small bowel obstruction) (HCC)   . AKI (acute kidney injury) (HCC)   . N&V (nausea and vomiting)   . Tobacco abuse disorder   . AAA (abdominal aortic aneurysm) (HCC)   . Chronic cor pulmonale (HCC)     No past surgical history on file.    Medication List       This list is accurate as of: 12/16/15 12:54 PM.  Always use your most recent med list.               amitriptyline 25 MG tablet  Commonly known as:  ELAVIL  Take 25 mg by mouth at bedtime.     apixaban 2.5 MG Tabs tablet  Commonly known as:  ELIQUIS  Take 1 tablet (2.5 mg total) by mouth 2 (two) times daily.     Cholecalciferol 5000 units Tabs  Take 1 tablet by mouth daily.     donepezil 5 MG tablet  Commonly known as:  ARICEPT  Take 5 mg by mouth at bedtime.     metoprolol tartrate 25 MG tablet  Commonly known as:  LOPRESSOR  Take 1 tablet (25 mg total) by mouth 2 (two) times daily.     pantoprazole  40 MG tablet  Commonly known as:  PROTONIX  Take 1 tablet (40 mg total) by mouth daily.     solifenacin 10 MG tablet  Commonly known as:  VESICARE  Take by mouth daily.        No orders of the defined types were placed in this encounter.     There is no immunization history on file for this patient.  Social History  Substance Use Topics  . Smoking status: Current Every Day Smoker -- 1.00 packs/day for 60 years    Types: Cigarettes  . Smokeless tobacco: Never Used     Comment: Unlikely to quit.   . Alcohol Use: No     Comment: Quit drinking 2 years ago    Family history is    Review of Systems  DATA OBTAINED: from patient, nurse, medical record, family member GENERAL:  no fevers, fatigue, appetite changes SKIN: No itching, rash or wounds EYES: No eye pain, redness, discharge EARS: No earache, tinnitus, change in hearing NOSE: No congestion, drainage or bleeding  MOUTH/THROAT: No mouth or tooth pain, No sore throat RESPIRATORY: No cough, wheezing, SOB CARDIAC: No chest pain, palpitations, lower extremity edema  GI: No abdominal pain, No N/V/D or constipation, No heartburn or reflux  GU: No dysuria, frequency or urgency, or incontinence  MUSCULOSKELETAL: No  unrelieved bone/joint pain NEUROLOGIC: No headache, dizziness or focal weakness PSYCHIATRIC: No c/o anxiety or sadness   Filed Vitals:   12/16/15 1250  BP: 114/67  Pulse: 52  Temp: 98.2 F (36.8 C)  Resp: 17    SpO2 Readings from Last 1 Encounters:  12/16/15 97%        Physical Exam  GENERAL APPEARANCE: Alert, conversant,  No acute distress.  SKIN: No diaphoresis rash HEAD: Normocephalic, atraumatic  EYES: Conjunctiva/lids clear. Pupils round, reactive. EOMs intact.  EARS: External exam WNL, canals clear. Hearing grossly normal.  NOSE: No deformity or discharge.  MOUTH/THROAT: Lips w/o lesions  RESPIRATORY: Breathing is even, unlabored. Lung sounds are clear   CARDIOVASCULAR: Heart RRR no  murmurs, rubs or gallops. No peripheral edema.   GASTROINTESTINAL: Abdomen is soft, non-tender, not distended w/ normal bowel sounds. GENITOURINARY: Bladder non tender, not distended  MUSCULOSKELETAL: No abnormal joints or musculature NEUROLOGIC:  Cranial nerves 2-12 grossly intact. Moves all extremities  PSYCHIATRIC: Mood and affect appropriate to situation, no behavioral issues  Patient Active Problem List   Diagnosis Date Noted  . Chronic atrial fibrillation (HCC) 12/11/2015  . UTI (urinary tract infection) 11/10/2015  . Depression 11/10/2015  . Abnormal EKG 10/25/2015  . Atrial fibrillation with rapid ventricular response (HCC) 10/25/2015  . Elevated troponin 10/25/2015  . Chronic cor pulmonale (HCC)   . Dementia without behavioral disturbance 10/23/2015  . Urinary frequency 10/23/2015  . Insomnia 10/23/2015  . Smoker 10/23/2015  . Language barrier to communication 10/23/2015  . SBO (small bowel obstruction) (HCC) 10/23/2015  . COPD (chronic obstructive pulmonary disease) (HCC) 10/23/2015  . AKI (acute kidney injury) (HCC) 10/23/2015  . Nausea and vomiting in adult 10/23/2015  . Tobacco abuse disorder 10/23/2015  . AAA (abdominal aortic aneurysm) without rupture (HCC) 10/23/2015    CBC    Component Value Date/Time   WBC 9.0 10/28/2015 0645   WBC 20.2* 10/23/2015 1421   RBC 4.63 10/28/2015 0645   RBC 5.50 10/23/2015 1421   HGB 14.5 10/28/2015 0645   HGB 17.7 10/23/2015 1421   HCT 43.6 10/28/2015 0645   HCT 50.4 10/23/2015 1421   PLT 214 10/28/2015 0645   MCV 94.2 10/28/2015 0645   MCV 91.6 10/23/2015 1421   LYMPHSABS 0.8 10/25/2015 0233   MONOABS 0.6 10/25/2015 0233   EOSABS 0.1 10/25/2015 0233   BASOSABS 0.0 10/25/2015 0233    CMP     Component Value Date/Time   NA 144 10/28/2015 0645   K 4.0 10/28/2015 0645   CL 115* 10/28/2015 0645   CO2 20* 10/28/2015 0645   GLUCOSE 91 10/28/2015 0645   BUN 23* 10/28/2015 0645   CREATININE 1.13 10/28/2015 0645    CALCIUM 8.3* 10/28/2015 0645   PROT 6.0* 10/27/2015 0235   ALBUMIN 2.9* 10/27/2015 0235   AST 29 10/27/2015 0235   ALT 18 10/27/2015 0235   ALKPHOS 51 10/27/2015 0235   BILITOT 1.1 10/27/2015 0235   GFRNONAA 57* 10/28/2015 0645   GFRAA >60 10/28/2015 0645    No results found for: HGBA1C   Ct Abdomen Pelvis Wo Contrast  10/23/2015  CLINICAL DATA:  Nausea and vomiting with generalized abdominal pain. EXAM: CT ABDOMEN AND PELVIS WITHOUT CONTRAST TECHNIQUE: Multidetector CT imaging of the abdomen and pelvis was performed following the standard protocol without IV contrast. COMPARISON:  Acute abdomen series from earlier today. FINDINGS: Lower chest: Emphysema noted in the lung bases with bronchial wall thickening and dependent atelectasis. Hepatobiliary: No focal abnormality in the  liver on this study without intravenous contrast. No evidence of hepatomegaly. Gallbladder is distended. No intrahepatic or extrahepatic biliary dilation. Pancreas: No focal mass lesion. No dilatation of the main duct. No intraparenchymal cyst. No peripancreatic edema. Spleen: No splenomegaly. No focal mass lesion. Adrenals/Urinary Tract: No adrenal nodule or mass. No renal stones or hydronephrosis. No definite renal mass lesion on this study without intravenous contrast material. No evidence for hydroureter. The urinary bladder appears normal for the degree of distention. Stomach/Bowel: Mild circumferential wall thickening is seen in the distal esophagus and there is a small hiatal hernia. Stomach otherwise unremarkable. Duodenum is normally positioned as is the ligament of Treitz. Proximal jejunal loops are opacified and not substantially dilated. However, mid small bowel is air-filled and dilated up to 4.2 cm in diameter. The distal ileal loops are completely decompressed and the terminal ileum is visible on image 41 series 21. No definite transition zone can be identified. The colon is largely decompressed throughout. No  evidence for bowel wall thickening. No pneumatosis. Vascular/Lymphatic: Abdominal aortic atherosclerosis noted with infrarenal abdominal aorta measuring up to 3.4 cm in diameter. There is no gastrohepatic or hepatoduodenal ligament lymphadenopathy. No intraperitoneal or retroperitoneal lymphadenopathy. No pelvic sidewall lymphadenopathy. Reproductive: The prostate gland and seminal vesicles have normal imaging features. Other: No intraperitoneal free fluid. No evidence for interloop mesenteric fluid Musculoskeletal: Patient is status post ORIF for proximal left femur fracture. Bones are diffusely demineralized Bone windows reveal no worrisome lytic or sclerotic osseous lesions. IMPRESSION: 1. Dilated small bowel up to 4.2 cm compatible with small bowel obstruction. The point of obstruction is probably in the mid to distal small bowel although a discrete transition zone cannot be identified on the current exam. There is no associated pneumatosis or bowel wall thickening. No interloop mesenteric fluid. 2. Circumferential wall thickening in the distal esophagus associated with small hiatal hernia. Changes in the esophagus may be related and reflux. 3. 3.4 cm abdominal aortic aneurysm. Recommend followup by ultrasound in 3 years. This recommendation follows ACR consensus guidelines: White Paper of the ACR Incidental Findings Committee II on Vascular Findings. Alba DestineJ Am Coll Radiol 2013; 10:789-794 Electronically Signed   By: Kennith CenterEric  Mansell M.D.   On: 10/23/2015 18:57   Dg Chest Port 1 View  10/23/2015  CLINICAL DATA:  Sepsis. EXAM: PORTABLE CHEST 1 VIEW COMPARISON:  Chest and abdominal radiographs earlier this day. Included lung bases from CT abdomen. FINDINGS: Lower lung volumes from prior exam. Developing ill-defined and linear bibasilar opacities. No pulmonary edema. No confluent airspace disease. No pleural effusion or pneumothorax. Dilated bowel loops in the right upper quadrant. IMPRESSION: Developing ill-defined  linear bibasilar opacities, likely atelectasis. Given bowel obstruction, aspiration could have a similar appearance. Electronically Signed   By: Rubye OaksMelanie  Ehinger M.D.   On: 10/23/2015 23:59   Dg Abd Acute W/chest  10/23/2015  CLINICAL DATA:  82 year old male with history of abdominal pain. EXAM: DG ABDOMEN ACUTE W/ 1V CHEST COMPARISON:  No priors. FINDINGS: Several small dense nodules in the right hemithorax are most compatible with calcified granulomas. Bilateral nipple shadows incidentally noted. No acute consolidative airspace disease. No pleural effusions. No evidence of pulmonary edema. No pneumothorax. Heart size is normal. Upper mediastinal contours are within normal limits. Atherosclerosis in the thoracic aorta. Numerous dilated loops of small bowel measuring up to 6.3 cm in diameter. There is a small amount of colonic and rectal gas and stool noted. No frank pneumoperitoneum confidently identified. Orthopedic fixation hardware in the left hip incompletely  visualize. Numerous vascular calcifications are noted. IMPRESSION: 1. Appearance of the abdomen is compatible with small bowel obstruction, as above. Given the presence of gas and stool in the colon, obstruction may be early, or could be partial. 2. No pneumoperitoneum. 3. No radiographic evidence of acute cardiopulmonary disease. 4. Atherosclerosis. Electronically Signed   By: Trudie Reed M.D.   On: 10/23/2015 14:39   Dg Abd Portable 1v  10/24/2015  CLINICAL DATA:  Small bowel obstruction EXAM: PORTABLE ABDOMEN - 1 VIEW COMPARISON:  CT abdomen pelvis dated 10/23/2015 FINDINGS: Dilated loops of small bowel in the central abdomen, including opacified loops of small bowel in the left mid abdomen. Contrast has not yet reached the colon. Degenerative changes of the lumbar spine. Status post ORIF of the left hip IMPRESSION: Dilated loops of small bowel, as above, compatible with small bowel obstruction. Contrast has not yet reached the colon.  Electronically Signed   By: Charline Bills M.D.   On: 10/24/2015 09:07   Dg Vangie Bicker G Tube Plc W/fl-no Rad  10/24/2015  CLINICAL DATA:  NASO G TUBE PLACEMENT WITH FLUORO Fluoroscopy was utilized by the requesting physician.  No radiographic interpretation.    Not all labs, radiology exams or other studies done during hospitalization come through on my EPIC note; however they are reviewed by me.    Assessment and Plan  No problem-specific assessment & plan notes found for this encounter.   Merrilee Seashore   MD

## 2015-12-16 NOTE — Progress Notes (Signed)
MRN: [id number] Name: Jesse Barber  Sex: male Age: 82 y.o. DOB: [birth year]  PSC #:  Facility/Room: Level Of Care: SNF Provider: Merrilee SeashoreALEXANDER, Carmin Alvidrez D Emergency Contacts: Extended Emergency Contact Information Primary Emergency Contact: [name],[name]  [place] Home Phone: [phone] Mobile Phone: [phone] Relation: Son Secondary Emergency Contact: [name],Sonja  [place] Home Phone: [phone] Work Phone: [phone] Mobile Phone: [phone] Relation: Other  Code Status:   Allergies: Review of patient's allergies indicates no known allergies.  Chief Complaint  Patient presents with  . Discharge Note    HPI: Patient is 82 y.o. male whowith past medical history of smoking, dementia, COPD, mild depression and osteoarthritis who was admitted c nausea vomiting and some abdominal discomfort -he was diagnosed with small bowel obstruction. [facility] where he was treated with NG tube and IVF with resolution. Hospital course was complicated by new onset AF with RVR and ARF and a UTI.Marland Kitchen. Pt was admitted to SNF for generalized weakness and post-op care. Pt is now ready to be d/c to home.   Past Medical History  Diagnosis Date  . Asthma   . Arthritis   . Depression   . Dementia   . Incontinence   . Abnormal EKG 10/25/2015  . Atrial fibrillation with rapid ventricular response (HCC) 10/25/2015  . Elevated troponin 10/25/2015  . COPD (chronic obstructive pulmonary disease) (HCC)   . SBO (small bowel obstruction) (HCC)   . AKI (acute kidney injury) (HCC)   . N&V (nausea and vomiting)   . Tobacco abuse disorder   . AAA (abdominal aortic aneurysm) (HCC)   . Chronic cor pulmonale (HCC)     No past surgical history on file.    Medication List       This list is accurate as of: 12/16/15  1:13 PM.  Always use your most recent med list.               amitriptyline 25 MG tablet  Commonly known as:  ELAVIL  Take  25 mg by mouth at bedtime.     apixaban 2.5 MG Tabs tablet  Commonly known as:  ELIQUIS  Take 1 tablet (2.5 mg total) by mouth 2 (two) times daily.     Cholecalciferol 5000 units Tabs  Take 1 tablet by mouth daily.     donepezil 5 MG tablet  Commonly known as:  ARICEPT  Take 5 mg by mouth at bedtime.     metoprolol tartrate 25 MG tablet  Commonly known as:  LOPRESSOR  Take 1 tablet (25 mg total) by mouth 2 (two) times daily.     pantoprazole 40 MG tablet  Commonly known as:  PROTONIX  Take 1 tablet (40 mg total) by mouth daily.     solifenacin 10 MG tablet  Commonly known as:  VESICARE  Take by mouth daily.        No orders of the defined types were placed in this encounter.     There is no immunization history on file for this patient.  Social History  Substance Use Topics  . Smoking status: Current Every Day Smoker -- 1.00 packs/day for 60 years    Types: Cigarettes  . Smokeless tobacco: Never Used     Comment: Unlikely to quit.   . Alcohol Use: No     Comment: Quit drinking 2 years ago    Filed Vitals:   12/16/15 1250  BP: 114/67  Pulse: 52  Temp: 98.2  F (36.8 C)  Resp: 17    Physical Exam  GENERAL APPEARANCE: Alert, conversant. No acute distress.  HEENT: Unremarkable. RESPIRATORY: Breathing is even, unlabored. Lung sounds are clear   CARDIOVASCULAR: Heart RRR no murmurs, rubs or gallops. No peripheral edema.  GASTROINTESTINAL: Abdomen is soft, non-tender, not distended w/ normal bowel sounds.  NEUROLOGIC: Cranial nerves 2-12 grossly intact. Moves all extremities  Patient Active Problem List   Diagnosis Date Noted  . Chronic atrial fibrillation (HCC) 12/11/2015  . UTI (urinary tract infection) 11/10/2015  . Depression 11/10/2015  . Abnormal EKG 10/25/2015  . Atrial fibrillation with rapid ventricular response (HCC) 10/25/2015  . Elevated troponin 10/25/2015  . Chronic cor pulmonale (HCC)   . Dementia without behavioral disturbance 10/23/2015   . Urinary frequency 10/23/2015  . Insomnia 10/23/2015  . Smoker 10/23/2015  . Language barrier to communication 10/23/2015  . SBO (small bowel obstruction) (HCC) 10/23/2015  . COPD (chronic obstructive pulmonary disease) (HCC) 10/23/2015  . AKI (acute kidney injury) (HCC) 10/23/2015  . Nausea and vomiting in adult 10/23/2015  . Tobacco abuse disorder 10/23/2015  . AAA (abdominal aortic aneurysm) without rupture (HCC) 10/23/2015    CBC    Component Value Date/Time   WBC 9.0 10/28/2015 0645   WBC 20.2* 10/23/2015 1421   RBC 4.63 10/28/2015 0645   RBC 5.50 10/23/2015 1421   HGB 14.5 10/28/2015 0645   HGB 17.7 10/23/2015 1421   HCT 43.6 10/28/2015 0645   HCT 50.4 10/23/2015 1421   PLT 214 10/28/2015 0645   MCV 94.2 10/28/2015 0645   MCV 91.6 10/23/2015 1421   LYMPHSABS 0.8 10/25/2015 0233   MONOABS 0.6 10/25/2015 0233   EOSABS 0.1 10/25/2015 0233   BASOSABS 0.0 10/25/2015 0233    CMP     Component Value Date/Time   NA 144 10/28/2015 0645   K 4.0 10/28/2015 0645   CL 115* 10/28/2015 0645   CO2 20* 10/28/2015 0645   GLUCOSE 91 10/28/2015 0645   BUN 23* 10/28/2015 0645   CREATININE 1.13 10/28/2015 0645   CALCIUM 8.3* 10/28/2015 0645   PROT 6.0* 10/27/2015 0235   ALBUMIN 2.9* 10/27/2015 0235   AST 29 10/27/2015 0235   ALT 18 10/27/2015 0235   ALKPHOS 51 10/27/2015 0235   BILITOT 1.1 10/27/2015 0235   GFRNONAA 57* 10/28/2015 0645   GFRAA >60 10/28/2015 0645    Assessment and Plan  Pt is d/c to home with HH/PT/CNA. Medications have been reconciled and Rx's written.   Time spent > 30 min;> 50% of time with patient was spent reviewing records, labs, tests and studies, counseling and developing plan of care  Margit Hanks, MD

## 2015-12-16 NOTE — Progress Notes (Signed)
MRN: [id number] Name: Jesse Barber  Sex: male Age: 82 y.o. DOB: 1928-08-30  [facility] #: [facility] Facility/Room: Level Of Care: SNF Provider: Merrilee SeashoreAlexander, Searra Carnathan MD Emergency Contacts: Extended Emergency Contact Information Primary Emergency Contact: [name],[name]  [place] Home Phone: [phone] Mobile Phone: [phone] Relation: Son Secondary Emergency Contact: [name],Sonja  [place] Home Phone: [phone] Work Phone: [phone] Mobile Phone: [phone] Relation: Other  Code Status:   Allergies: Review of patient's allergies indicates no known allergies.  Chief Complaint  Patient presents with  . Discharge Note    HPI: Patient is 82 y.o. male who  Past Medical History  Diagnosis Date  . Asthma   . Arthritis   . Depression   . Dementia   . Incontinence   . Abnormal EKG 10/25/2015  . Atrial fibrillation with rapid ventricular response (HCC) 10/25/2015  . Elevated troponin 10/25/2015  . COPD (chronic obstructive pulmonary disease) (HCC)   . SBO (small bowel obstruction) (HCC)   . AKI (acute kidney injury) (HCC)   . N&V (nausea and vomiting)   . Tobacco abuse disorder   . AAA (abdominal aortic aneurysm) (HCC)   . Chronic cor pulmonale (HCC)     No past surgical history on file.    Medication List       This list is accurate as of: 12/16/15 12:40 PM.  Always use your most recent med list.               amitriptyline 25 MG tablet  Commonly known as:  ELAVIL  Take 25 mg by mouth at bedtime.     apixaban 2.5 MG Tabs tablet  Commonly known as:  ELIQUIS  Take 1 tablet (2.5 mg total) by mouth 2 (two) times daily.     Cholecalciferol 5000 units Tabs  Take 1 tablet by mouth daily.     donepezil 5 MG tablet  Commonly known as:  ARICEPT  Take 5 mg by mouth at bedtime.     metoprolol tartrate 25 MG tablet  Commonly known as:  LOPRESSOR  Take 1 tablet (25 mg total) by mouth 2 (two) times daily.     pantoprazole 40  MG tablet  Commonly known as:  PROTONIX  Take 1 tablet (40 mg total) by mouth daily.     solifenacin 10 MG tablet  Commonly known as:  VESICARE  Take by mouth daily.        No orders of the defined types were placed in this encounter.     There is no immunization history on file for this patient.  Social History  Substance Use Topics  . Smoking status: Current Every Day Smoker -- 1.00 packs/day for 60 years    Types: Cigarettes  . Smokeless tobacco: Never Used     Comment: Unlikely to quit.   . Alcohol Use: No     Comment: Quit drinking 2 years ago    There were no vitals filed for this visit.  Physical Exam  GENERAL APPEARANCE: Alert, conversant. No acute distress.  HEENT: Unremarkable. RESPIRATORY: Breathing is even, unlabored. Lung sounds are clear   CARDIOVASCULAR: Heart RRR no murmurs, rubs or gallops. No peripheral edema.  GASTROINTESTINAL: Abdomen is soft, non-tender, not distended w/ normal bowel sounds.  NEUROLOGIC: Cranial nerves 2-12 grossly intact. Moves all extremities  Patient Active Problem List   Diagnosis Date Noted  . Chronic atrial fibrillation (HCC) 12/11/2015  . UTI (urinary tract infection) 11/10/2015  . Depression 11/10/2015  . Abnormal EKG  10/25/2015  . Atrial fibrillation with rapid ventricular response (HCC) 10/25/2015  . Elevated troponin 10/25/2015  . Chronic cor pulmonale (HCC)   . Dementia without behavioral disturbance 10/23/2015  . Urinary frequency 10/23/2015  . Insomnia 10/23/2015  . Smoker 10/23/2015  . Language barrier to communication 10/23/2015  . SBO (small bowel obstruction) (HCC) 10/23/2015  . COPD (chronic obstructive pulmonary disease) (HCC) 10/23/2015  . AKI (acute kidney injury) (HCC) 10/23/2015  . Nausea and vomiting in adult 10/23/2015  . Tobacco abuse disorder 10/23/2015  . AAA (abdominal aortic aneurysm) without rupture (HCC) 10/23/2015    CBC    Component Value Date/Time   WBC 9.0 10/28/2015 0645   WBC  20.2* 10/23/2015 1421   RBC 4.63 10/28/2015 0645   RBC 5.50 10/23/2015 1421   HGB 14.5 10/28/2015 0645   HGB 17.7 10/23/2015 1421   HCT 43.6 10/28/2015 0645   HCT 50.4 10/23/2015 1421   PLT 214 10/28/2015 0645   MCV 94.2 10/28/2015 0645   MCV 91.6 10/23/2015 1421   LYMPHSABS 0.8 10/25/2015 0233   MONOABS 0.6 10/25/2015 0233   EOSABS 0.1 10/25/2015 0233   BASOSABS 0.0 10/25/2015 0233    CMP     Component Value Date/Time   NA 144 10/28/2015 0645   K 4.0 10/28/2015 0645   CL 115* 10/28/2015 0645   CO2 20* 10/28/2015 0645   GLUCOSE 91 10/28/2015 0645   BUN 23* 10/28/2015 0645   CREATININE 1.13 10/28/2015 0645   CALCIUM 8.3* 10/28/2015 0645   PROT 6.0* 10/27/2015 0235   ALBUMIN 2.9* 10/27/2015 0235   AST 29 10/27/2015 0235   ALT 18 10/27/2015 0235   ALKPHOS 51 10/27/2015 0235   BILITOT 1.1 10/27/2015 0235   GFRNONAA 57* 10/28/2015 0645   GFRAA >60 10/28/2015 0645    Assessment and Plan  No problem-specific assessment & plan notes found for this encounter.   Merrilee Seashore  MD

## 2015-12-17 DIAGNOSIS — K5669 Other intestinal obstruction: Secondary | ICD-10-CM | POA: Diagnosis not present

## 2015-12-17 DIAGNOSIS — M6281 Muscle weakness (generalized): Secondary | ICD-10-CM | POA: Diagnosis not present

## 2015-12-19 DIAGNOSIS — M6281 Muscle weakness (generalized): Secondary | ICD-10-CM | POA: Diagnosis not present

## 2015-12-19 DIAGNOSIS — K5669 Other intestinal obstruction: Secondary | ICD-10-CM | POA: Diagnosis not present

## 2015-12-20 DIAGNOSIS — M6281 Muscle weakness (generalized): Secondary | ICD-10-CM | POA: Diagnosis not present

## 2015-12-20 DIAGNOSIS — K5669 Other intestinal obstruction: Secondary | ICD-10-CM | POA: Diagnosis not present

## 2015-12-21 NOTE — Progress Notes (Signed)
PT Note - Late G Code Entry    10/28/15 1401  PT G-Codes **NOT FOR INPATIENT CLASS**  Functional Assessment Tool Used clinical judgement  Functional Limitation Mobility: Walking and moving around  Mobility: Walking and Moving Around Current Status [phone](G8978) CJ  Mobility: Walking and Moving Around Goal Status [phone]([phone]) [facility]  Jesse Barber PT [phone]

## 2015-12-28 NOTE — Progress Notes (Signed)
   12/28/15 1500  OT G-codes **NOT FOR INPATIENT CLASS**  Functional Assessment Tool Used Clinical Judgement   Functional Limitation Self care  Self Care Current Status [phone]([phone]) CK  Self Care Goal Status ([phone](G8988) CI   Edwin CapPatricia Tangy Drozdowski, MS., OTR/L, CLT Pager: [phone]

## 2016-01-18 ENCOUNTER — Other Ambulatory Visit: Payer: Self-pay | Admitting: Internal Medicine

## 2016-01-21 ENCOUNTER — Telehealth: Payer: Self-pay | Admitting: Physician Assistant

## 2016-01-21 NOTE — Telephone Encounter (Signed)
The patient's son presents at the front desk asking to speak with me.  His father was seen here 10/23/2015 and sent to the ED via EMS with a small bowel obstruction.  He was discharged to a rehab facility and is now at home.  He needs home health to come and provide services and asks for my help.  He has brought all the papers they were given at discharge.  The papers indicate that he has been assigned to Dr[facility] Lerry Linerwight [facility] and that he needs PT, home health CNA to help with ADLs and supervision for patient safety.  The son wasn't aware of the appointment on 5/18 with Dr. Mayford KnifeWilliams, so they did not go.  I spoke with the staff at the rehab facility, and was advised that their home health orders end on the day of the follow-up visit, and additional orders must be signed by the provider. Since he didn't go to the appointment, he has no active orders.  I reviewed this information with the son, who expresses understanding. He will contact [facility] and reschedule.

## 2016-01-22 ENCOUNTER — Other Ambulatory Visit: Payer: Self-pay | Admitting: Internal Medicine

## 2016-02-01 ENCOUNTER — Other Ambulatory Visit: Payer: Self-pay | Admitting: Internal Medicine

## 2016-02-08 ENCOUNTER — Other Ambulatory Visit: Payer: Self-pay | Admitting: Internal Medicine

## 2016-02-08 DIAGNOSIS — I973 Postprocedural hypertension: Secondary | ICD-10-CM | POA: Diagnosis not present

## 2016-02-23 ENCOUNTER — Telehealth: Payer: Self-pay | Admitting: Cardiovascular Disease

## 2016-02-24 NOTE — Telephone Encounter (Signed)
Closed encounter °

## 2016-02-29 ENCOUNTER — Ambulatory Visit (INDEPENDENT_AMBULATORY_CARE_PROVIDER_SITE_OTHER): Payer: Medicare Other | Admitting: Cardiovascular Disease

## 2016-02-29 VITALS — BP 110/72 | HR 50 | Ht 65.0 in | Wt 137.4 lb

## 2016-02-29 DIAGNOSIS — I2510 Atherosclerotic heart disease of native coronary artery without angina pectoris: Secondary | ICD-10-CM | POA: Diagnosis not present

## 2016-02-29 DIAGNOSIS — R490 Dysphonia: Secondary | ICD-10-CM

## 2016-02-29 DIAGNOSIS — I482 Chronic atrial fibrillation, unspecified: Secondary | ICD-10-CM

## 2016-02-29 DIAGNOSIS — R634 Abnormal weight loss: Secondary | ICD-10-CM | POA: Diagnosis not present

## 2016-02-29 DIAGNOSIS — J449 Chronic obstructive pulmonary disease, unspecified: Secondary | ICD-10-CM

## 2016-02-29 MED ORDER — METOPROLOL TARTRATE 25 MG PO TABS: 12.5000 mg | ORAL_TABLET | Freq: Two times a day (BID) | ORAL | Status: AC

## 2016-02-29 NOTE — Patient Instructions (Signed)
Dr Royann Shiversroitoru has recommended making the following medication changes: 1. DECREASE Metoprolol to 12.5 mg - take 0.5 tablet twice daily  Dr Royann Shiversroitoru recommends that you schedule a follow-up appointment in 6 months. You will receive a reminder letter in the mail two months in advance. If you don't receive a letter, please call our office to schedule the follow-up appointment.  If you need a refill on your cardiac medications before your next appointment, please call your pharmacy.

## 2016-02-29 NOTE — Progress Notes (Signed)
Patient ID: Jesse Barber, male   DOB: 01-13-1929, 82 y.o.   MRN: [id number]    Cardiology Office Note    Date:  03/02/2016   ID:  Quandarius, Nill 1928/11/10, MRN [id number]  PCP:  Margit Hanks, MD  Cardiologist:   Thurmon Fair, MD   Chief Complaint  Patient presents with  . Atrial Fibrillation    pt denied chest pain     History of Present Illness:  Jesse Barber is a 82 y.o. male recently diagnosed with atrial fibrillation during a hospitalization for small bowel obstruction and acute renal insufficiency. He also has some evidence of dementia and depression. After undergoing rehabilitation, he is back at home living with his son.  He has no cardiac complaints. He is unaware of palpitations. His dyspnea is at baseline. He denies angina, focal neurological events, bleeding problems, leg edema. The biggest problem appears to be his very poor appetite. He has lost substantial weight. His blood pressure is no longer low today but he is quite bradycardic; he denies dizziness, lightheadedness, syncope or fatigue. He remains in atrial fibrillation and is oblivious to the arrhythmia. He is on chronic anticoagulation without any bleeding problems and without evidence of focal neurological events.  The patient has constant rhinorrhea and is hoarse. His son has forced him to stop smoking. He simply no longer buy some cigarettes.  His echocardiogram showed normal left ventricular size and systolic function but showed moderate dilation of the right heart chambers and moderate pulmonary hypertension, likely COPD-related cor pulmonale. He had a nuclear stress testthat showed inferior/inferoseptal wall scar and ejection fraction of 48%. There was no clear evidence of reversible ischemia, although there was a small equivocal abnormality at the level of the apex. Mild elevation in cardiac troponin during his hospitalization was likely related to acute illness and rapid ventricular response due to atrial  fibrillation. There was no ECG evidence of ischemic changes and no wall motion abnormalities were seen on echocardiography.  He is accompanied by [name], his son, who provides translation. He describes his father's memory as poor and worsening, but he is otherwise content and happy.  Past Medical History  Diagnosis Date  . Asthma   . Arthritis   . Depression   . Dementia   . Incontinence   . Abnormal EKG 10/25/2015  . Atrial fibrillation with rapid ventricular response (HCC) 10/25/2015  . Elevated troponin 10/25/2015  . COPD (chronic obstructive pulmonary disease) (HCC)   . SBO (small bowel obstruction) (HCC)   . AKI (acute kidney injury) (HCC)   . N&V (nausea and vomiting)   . Tobacco abuse disorder   . AAA (abdominal aortic aneurysm) (HCC)   . Chronic cor pulmonale (HCC)     No past surgical history on file.  Current Medications: Outpatient Prescriptions Prior to Visit  Medication Sig Dispense Refill  . amitriptyline (ELAVIL) 25 MG tablet Take 25 mg by mouth at bedtime.    Marland Kitchen apixaban (ELIQUIS) 2.5 MG TABS tablet Take 1 tablet (2.5 mg total) by mouth 2 (two) times daily. 60 tablet 0  . donepezil (ARICEPT) 5 MG tablet Take 5 mg by mouth at bedtime.    . pantoprazole (PROTONIX) 40 MG tablet Take 1 tablet (40 mg total) by mouth daily. 30 tablet 0  . solifenacin (VESICARE) 10 MG tablet Take by mouth daily.    . metoprolol tartrate (LOPRESSOR) 25 MG tablet Take 1 tablet (25 mg total) by mouth 2 (two) times daily. 60 tablet 0  .  Cholecalciferol 5000 units TABS Take 1 tablet by mouth daily.     No facility-administered medications prior to visit.     Allergies:   Review of patient's allergies indicates no known allergies.   Social History   Social History  . Marital Status: Single    Spouse Name: N/A  . Number of Children: N/A  . Years of Education: N/A   Occupational History  . retired    Social History Main Topics  . Smoking status: Current Every Day Smoker -- 1.00  packs/day for 60 years    Types: Cigarettes  . Smokeless tobacco: Never Used     Comment: Unlikely to quit.   . Alcohol Use: No     Comment: Quit drinking 2 years ago  . Drug Use: No  . Sexual Activity: No   Other Topics Concern  . Not on file   Social History Narrative   Originally [place]   [place] in [place] and lives with his son and daughter-in-law.     Family History:  The patient's Significantly negative for coronary disease or other cardiac illnesses.  ROS:   Please see the history of present illness.    ROS All other systems reviewed and are negative.   PHYSICAL EXAM:   VS:  BP 110/72 mmHg  Pulse 50  Ht 5\' 5"  (1.651 m)  Wt 62.324 kg (137 lb 6.4 oz)  BMI 22.86 kg/m2   GEN: Well nourished, well developed, in no acute distress HEENT: normalNo lymphadenopathy identified; no facial asymmetry Neck: no JVD, carotid bruits, or masses Cardiac: Irregular rhythm, normal S1 and S2; no murmurs, rubs, or gallops,no edema  Respiratory:  Severely diminished breath sounds bilaterally, but no active wheezing, normal work of breathing GI: soft, nontender, nondistended, + BS MS: no deformity or atrophy Skin: warm and dry, no rash Neuro:  Alert and Oriented x 3, Strength and sensation are intact Psych: euthymic mood, full affect  Wt Readings from Last 3 Encounters:  02/29/16 62.324 kg (137 lb 6.4 oz)  11/18/15 57.664 kg (127 lb 2 oz)  11/01/15 54.432 kg (120 lb)      Studies/Labs Reviewed:   EKG:  EKG is ordered today.  The ekg ordered today demonstrates Atrial fibrillation With slow ventricular response, QS pattern in leads V1-V3, minor nonspecific ST segment changes in the inferior and lateral leads, similar to previous tracing   Recent Labs: 10/24/2015: TSH 2.622 10/27/2015: ALT 18; Magnesium 1.7 10/28/2015: BUN 23*; Creatinine, Ser 1.13; Hemoglobin 14.5; Platelets 214; Potassium 4.0; Sodium 144   Lipid Panel    Component Value Date/Time   CHOL 151 10/26/2015  0332   TRIG 120 10/26/2015 0332   HDL 32* 10/26/2015 0332   CHOLHDL 4.7 10/26/2015 0332   VLDL 24 10/26/2015 0332   LDLCALC 95 10/26/2015 0332    ASSESSMENT:    1. Chronic atrial fibrillation (HCC)   2. Chronic obstructive pulmonary disease, unspecified COPD type (HCC)   3. Coronary artery disease involving native coronary artery of native heart without angina pectoris   4. Weight loss, unintentional   5. Hoarseness      PLAN:  In order of problems listed above:  1. AFib: It seems quite likely that this is actually a long-standing chronic arrhythmia that only came to light during his recent hospitalization for small bowel obstruction. Rate control is now excessive, although it was quite challenging during his hospitalization when he was unable to take by mouth medications and he is tolerating  anticoagulation without any bleeding problems. CHADSVasc at least 74 (age, PAD). We'll reduce the beta blocker dose in half. He has normal left ventricular systolic and diastolic parameters and had mild-moderate valvular abnormalities, that are likely not related to the atrial fibrillation. 2. COPD/cor pulmonale: He had a dilated right ventricle and right atrium and moderate pulmonary hypertension by echocardiography. This is likely the underlying substrate for his arrhythmia. He has not been smoking for last 3 months. He has clear findings of emphysema on physical exam. 3. Probable CAD: The absence of angina pectoris and with the findings on his recent nuclear stress test I would not recommend further coronary evaluation. Aspirin stopped since he is on treatment with a direct oral anticoagulant. He continues to lose weight and is likely that his cholesterol levels are even lower than when we last tested them. 4. I'm concerned about the constellation of long-standing smoking (now quit) and weight loss and hoarseness. He should probably undergo ENT evaluation. It is possible that his symptoms are related  to allergies since he also has rhinorrhea, but he has high risk for smoking-related malignancy of the lung or head and neck area    Medication Adjustments/Labs and Tests Ordered: Current medicines are reviewed at length with the patient today.  Concerns regarding medicines are outlined above.  Medication changes, Labs and Tests ordered today are listed in the Patient Instructions below. Patient Instructions  Dr Royann Shivers has recommended making the following medication changes: 1. DECREASE Metoprolol to 12.5 mg - take 0.5 tablet twice daily  Dr Royann Shivers recommends that you schedule a follow-up appointment in 6 months. You will receive a reminder letter in the mail two months in advance. If you don't receive a letter, please call our office to schedule the follow-up appointment.  If you need a refill on your cardiac medications before your next appointment, please call your pharmacy.     Signed, Thurmon Fair, MD  03/02/2016 7:18 PM    [facility] [address] [place], [place], [place]  [address] Phone: [phone]; Fax: [phone]

## 2016-03-02 ENCOUNTER — Encounter: Payer: Self-pay | Admitting: Cardiovascular Disease

## 2016-03-02 DIAGNOSIS — R490 Dysphonia: Secondary | ICD-10-CM | POA: Insufficient documentation

## 2016-03-02 DIAGNOSIS — I2510 Atherosclerotic heart disease of native coronary artery without angina pectoris: Secondary | ICD-10-CM | POA: Insufficient documentation

## 2016-03-02 DIAGNOSIS — R634 Abnormal weight loss: Secondary | ICD-10-CM | POA: Insufficient documentation

## 2016-09-20 ENCOUNTER — Other Ambulatory Visit: Payer: Self-pay | Admitting: Internal Medicine

## 2016-10-03 ENCOUNTER — Other Ambulatory Visit: Payer: Self-pay | Admitting: Internal Medicine

## 2016-10-10 ENCOUNTER — Other Ambulatory Visit: Payer: Self-pay | Admitting: Internal Medicine

## 2016-11-05 ENCOUNTER — Encounter (HOSPITAL_COMMUNITY): Payer: Self-pay | Admitting: Emergency Medicine

## 2016-11-05 ENCOUNTER — Inpatient Hospital Stay (HOSPITAL_COMMUNITY)
Admission: EM | Admit: 2016-11-05 | Discharge: 2016-11-07 | DRG: 191 | Disposition: A | Discharge status: Home Health | Payer: Medicare Other | Attending: Internal Medicine | Admitting: Internal Medicine

## 2016-11-05 ENCOUNTER — Emergency Department (HOSPITAL_COMMUNITY): Payer: Medicare Other

## 2016-11-05 DIAGNOSIS — Z6822 Body mass index (BMI) 22.0-22.9, adult: Secondary | ICD-10-CM | POA: Diagnosis not present

## 2016-11-05 DIAGNOSIS — I2781 Cor pulmonale (chronic): Secondary | ICD-10-CM | POA: Diagnosis present

## 2016-11-05 DIAGNOSIS — J441 Chronic obstructive pulmonary disease with (acute) exacerbation: Principal | ICD-10-CM

## 2016-11-05 DIAGNOSIS — R0602 Shortness of breath: Secondary | ICD-10-CM | POA: Diagnosis present

## 2016-11-05 DIAGNOSIS — R64 Cachexia: Secondary | ICD-10-CM | POA: Diagnosis present

## 2016-11-05 DIAGNOSIS — G309 Alzheimer's disease, unspecified: Secondary | ICD-10-CM | POA: Diagnosis present

## 2016-11-05 DIAGNOSIS — I10 Essential (primary) hypertension: Secondary | ICD-10-CM | POA: Diagnosis present

## 2016-11-05 DIAGNOSIS — Z87891 Personal history of nicotine dependence: Secondary | ICD-10-CM

## 2016-11-05 DIAGNOSIS — E860 Dehydration: Secondary | ICD-10-CM | POA: Diagnosis present

## 2016-11-05 DIAGNOSIS — Z79899 Other long term (current) drug therapy: Secondary | ICD-10-CM

## 2016-11-05 DIAGNOSIS — Z7901 Long term (current) use of anticoagulants: Secondary | ICD-10-CM

## 2016-11-05 DIAGNOSIS — I2510 Atherosclerotic heart disease of native coronary artery without angina pectoris: Secondary | ICD-10-CM | POA: Diagnosis present

## 2016-11-05 DIAGNOSIS — R627 Adult failure to thrive: Secondary | ICD-10-CM | POA: Diagnosis present

## 2016-11-05 DIAGNOSIS — I714 Abdominal aortic aneurysm, without rupture: Secondary | ICD-10-CM | POA: Diagnosis present

## 2016-11-05 DIAGNOSIS — M1990 Unspecified osteoarthritis, unspecified site: Secondary | ICD-10-CM | POA: Diagnosis present

## 2016-11-05 DIAGNOSIS — E440 Moderate protein-calorie malnutrition: Secondary | ICD-10-CM | POA: Diagnosis present

## 2016-11-05 DIAGNOSIS — R6251 Failure to thrive (child): Secondary | ICD-10-CM

## 2016-11-05 DIAGNOSIS — N179 Acute kidney failure, unspecified: Secondary | ICD-10-CM

## 2016-11-05 DIAGNOSIS — F0280 Dementia in other diseases classified elsewhere without behavioral disturbance: Secondary | ICD-10-CM | POA: Diagnosis present

## 2016-11-05 DIAGNOSIS — F329 Major depressive disorder, single episode, unspecified: Secondary | ICD-10-CM | POA: Diagnosis present

## 2016-11-05 DIAGNOSIS — I482 Chronic atrial fibrillation, unspecified: Secondary | ICD-10-CM | POA: Diagnosis present

## 2016-11-05 DIAGNOSIS — F0390 Unspecified dementia without behavioral disturbance: Secondary | ICD-10-CM | POA: Diagnosis present

## 2016-11-05 LAB — CREATININE, SERUM
Creatinine, Ser: 1.63 mg/dL — ABNORMAL HIGH (ref 0.61–1.24)
GFR calc Af Amer: 42 mL/min — ABNORMAL LOW (ref >60)
GFR calc non Af Amer: 37 mL/min — ABNORMAL LOW (ref >60)

## 2016-11-05 LAB — CBC
HCT: 55.6 % — ABNORMAL HIGH (ref 39.0–52.0)
HCT: 53.3 % — ABNORMAL HIGH (ref 39.0–52.0)
Hemoglobin: 18.3 g/dL — ABNORMAL HIGH (ref 13.0–17.0)
Hemoglobin: 17.6 g/dL — ABNORMAL HIGH (ref 13.0–17.0)
MCH: 31.1 pg (ref 26.0–34.0)
MCH: 30.7 pg (ref 26.0–34.0)
MCHC: 32.9 g/dL (ref 30.0–36.0)
MCHC: 33 g/dL (ref 30.0–36.0)
MCV: 94.4 fL (ref 78.0–100.0)
MCV: 93 fL (ref 78.0–100.0)
PLATELETS: 166 10*3/uL (ref 150–400)
PLATELETS: 180 10*3/uL (ref 150–400)
RBC: 5.89 MIL/uL — ABNORMAL HIGH (ref 4.22–5.81)
RBC: 5.73 MIL/uL (ref 4.22–5.81)
RDW: 15.2 % (ref 11.5–15.5)
RDW: 15.1 % (ref 11.5–15.5)
WBC: 12.8 10*3/uL — ABNORMAL HIGH (ref 4.0–10.5)
WBC: 11.9 10*3/uL — ABNORMAL HIGH (ref 4.0–10.5)

## 2016-11-05 LAB — HEPATIC FUNCTION PANEL
ALK PHOS: 74 U/L (ref 38–126)
ALT: 27 U/L (ref 17–63)
AST: 34 U/L (ref 15–41)
Albumin: 3.5 g/dL (ref 3.5–5.0)
BILIRUBIN INDIRECT: 0.9 mg/dL (ref 0.3–0.9)
Bilirubin, Direct: 0.3 mg/dL (ref 0.1–0.5)
TOTAL PROTEIN: 7.1 g/dL (ref 6.5–8.1)
Total Bilirubin: 1.2 mg/dL (ref 0.3–1.2)

## 2016-11-05 LAB — I-STAT TROPONIN, ED: Troponin i, poc: 0.02 ng/mL (ref 0.00–0.08)

## 2016-11-05 LAB — I-STAT ARTERIAL BLOOD GAS, ED
Acid-base deficit: 5 mmol/L — ABNORMAL HIGH (ref 0.0–2.0)
Bicarbonate: 18.1 mmol/L — ABNORMAL LOW (ref 20.0–28.0)
O2 SAT: 96 %
PO2 ART: 80 mmHg — AB (ref 83.0–108.0)
Patient temperature: 97.6
TCO2: 19 mmol/L (ref 0–100)
pCO2 arterial: 28.6 mmHg — ABNORMAL LOW (ref 32.0–48.0)
pH, Arterial: 7.406 (ref 7.350–7.450)

## 2016-11-05 LAB — BASIC METABOLIC PANEL
ANION GAP: 15 (ref 5–15)
BUN: 50 mg/dL — AB (ref 6–20)
CALCIUM: 8.9 mg/dL (ref 8.9–10.3)
CO2: 17 mmol/L — ABNORMAL LOW (ref 22–32)
Chloride: 103 mmol/L (ref 101–111)
Creatinine, Ser: 1.45 mg/dL — ABNORMAL HIGH (ref 0.61–1.24)
GFR calc Af Amer: 49 mL/min — ABNORMAL LOW (ref >60)
GFR, EST NON AFRICAN AMERICAN: 42 mL/min — AB (ref >60)
GLUCOSE: 126 mg/dL — AB (ref 65–99)
Potassium: 5 mmol/L (ref 3.5–5.1)
Sodium: 135 mmol/L (ref 135–145)

## 2016-11-05 LAB — BRAIN NATRIURETIC PEPTIDE: B Natriuretic Peptide: 305.4 pg/mL — ABNORMAL HIGH (ref 0.0–100.0)

## 2016-11-05 LAB — TSH: TSH: 1.806 u[IU]/mL (ref 0.350–4.500)

## 2016-11-05 MED ORDER — IPRATROPIUM-ALBUTEROL 0.5-2.5 (3) MG/3ML IN SOLN
3.0000 mL | Freq: Four times a day (QID) | RESPIRATORY_TRACT | Status: DC
  Administered 2016-11-05: 3 mL via RESPIRATORY_TRACT
  Filled 2016-11-05: qty 3

## 2016-11-05 MED ORDER — IPRATROPIUM-ALBUTEROL 0.5-2.5 (3) MG/3ML IN SOLN
3.0000 mL | Freq: Once | RESPIRATORY_TRACT | Status: AC
  Administered 2016-11-05: 3 mL via RESPIRATORY_TRACT
  Filled 2016-11-05: qty 3

## 2016-11-05 MED ORDER — ALBUTEROL SULFATE (2.5 MG/3ML) 0.083% IN NEBU: 2.5000 mg | INHALATION_SOLUTION | RESPIRATORY_TRACT | Status: DC | PRN

## 2016-11-05 MED ORDER — PREDNISONE 20 MG PO TABS: 20.0000 mg | ORAL_TABLET | Freq: Every day | ORAL | Status: DC

## 2016-11-05 MED ORDER — SODIUM CHLORIDE 0.9 % IV SOLN
INTRAVENOUS | Status: DC
  Administered 2016-11-05 – 2016-11-06 (×2): via INTRAVENOUS
  Administered 2016-11-06: 1000 mL via INTRAVENOUS

## 2016-11-05 MED ORDER — METHYLPREDNISOLONE SODIUM SUCC 40 MG IJ SOLR
40.0000 mg | Freq: Once | INTRAMUSCULAR | Status: AC
  Administered 2016-11-05: 40 mg via INTRAVENOUS
  Filled 2016-11-05: qty 1

## 2016-11-05 MED ORDER — SODIUM CHLORIDE 0.9% FLUSH
3.0000 mL | Freq: Two times a day (BID) | INTRAVENOUS | Status: DC
  Administered 2016-11-05 – 2016-11-07 (×5): 3 mL via INTRAVENOUS

## 2016-11-05 MED ORDER — ONDANSETRON HCL 4 MG PO TABS: 4.0000 mg | ORAL_TABLET | Freq: Four times a day (QID) | ORAL | Status: DC | PRN

## 2016-11-05 MED ORDER — HEPARIN SODIUM (PORCINE) 5000 UNIT/ML IJ SOLN
5000.0000 [IU] | Freq: Three times a day (TID) | INTRAMUSCULAR | Status: DC
  Administered 2016-11-05 – 2016-11-06 (×2): 5000 [IU] via SUBCUTANEOUS
  Filled 2016-11-05 (×2): qty 1

## 2016-11-05 MED ORDER — BISACODYL 10 MG RE SUPP: 10.0000 mg | Freq: Every day | RECTAL | Status: DC | PRN

## 2016-11-05 MED ORDER — SODIUM CHLORIDE 0.9 % IV BOLUS (SEPSIS)
500.0000 mL | Freq: Once | INTRAVENOUS | Status: AC
  Administered 2016-11-05: 500 mL via INTRAVENOUS

## 2016-11-05 MED ORDER — SENNOSIDES-DOCUSATE SODIUM 8.6-50 MG PO TABS
1.0000 | ORAL_TABLET | Freq: Every evening | ORAL | Status: DC | PRN
  Filled 2016-11-05: qty 1

## 2016-11-05 MED ORDER — SODIUM CHLORIDE 0.9 % IV SOLN
Freq: Once | INTRAVENOUS | Status: AC
  Administered 2016-11-05: 14:00:00 via INTRAVENOUS

## 2016-11-05 MED ORDER — ALBUTEROL SULFATE (2.5 MG/3ML) 0.083% IN NEBU: 5.0000 mg | INHALATION_SOLUTION | Freq: Once | RESPIRATORY_TRACT | Status: DC

## 2016-11-05 MED ORDER — ONDANSETRON HCL 4 MG/2ML IJ SOLN: 4.0000 mg | Freq: Four times a day (QID) | INTRAMUSCULAR | Status: DC | PRN

## 2016-11-05 NOTE — H&P (Signed)
[facility] History and Physical  Jesse HamburgerJagos Nobbe ZOX:[id number] DOB: 06/11/1929 DOA: 11/05/2016  Referring physician:  ED MD PCP: Merrilee SeashoreAnne Alexander, MD   Chief Complaint: sob/doe  HPI: Jesse Barber is a 83 y.o. male male with a history of smoking, dementia, COPD, mild depression in the past, osteoarthritis, history of chronic atrial fibrillation as well as coronary artery disease and chronic cor pulmonale by history, presents to our ED for further workup secondary to acute onset shortness of breath and dyspnea on exertion this morning noted. All history is given by patient's daughter-in-law and son who are at bedside. Patient is noncommunicative and nonverbal.  Patient has had decreased by mouth intake for the last 5 or 6 days barely drinking any water or tea. Not eating much for the last few days and has been fairly bed bound week for the last couple days w/ progressive generalized weakness. Of note, also had wet chest cough this past week as well. The son talked to family doctor friend, who prescribed  Patient PO steroids and  Antibiotics (unknown name). Pt finished the abx yesterday and cough is better. Pt did not want to take the prednisone. Of note, pt has stopped taking all his meds for the last 3-4 wks as well. Family states they work all day, have been trying to get hhc or someone to watch pt for few hrs a day, via pcp w/o success.  Per family, prior to this past few wks, pt was in nml state of health.  They think he may have lost 5lbs in past week, but not certain.  Currently in Ed, pt appears comfortable. Family states the breathing treatment seems to have helped some.  ED VS: 97.6, rr 24, 90 afib, 161/84, 88% ra    Review of Systems:  Unable to access due to pt's dementia.  Past Medical History:  Diagnosis Date  . AAA (abdominal aortic aneurysm) (HCC)   . Abnormal EKG 10/25/2015  . AKI (acute kidney injury) (HCC)   . Arthritis   . Asthma   . Atrial fibrillation with rapid  ventricular response (HCC) 10/25/2015  . Chronic cor pulmonale (HCC)   . COPD (chronic obstructive pulmonary disease) (HCC)   . Dementia   . Depression   . Elevated troponin 10/25/2015  . Incontinence   . N&V (nausea and vomiting)   . SBO (small bowel obstruction)   . Tobacco abuse disorder    History reviewed. No pertinent surgical history. Social History:  reports that he quit smoking about a year ago. His smoking use included Cigarettes. He has a 60.00 pack-year smoking history. He has never used smokeless tobacco. He reports that he does not drink alcohol or use drugs.  No Known Allergies  No family history on file.  Reviewed, noncontributory.  Prior to Admission medications   Medication Sig Start Date End Date Taking? Authorizing Provider  amitriptyline (ELAVIL) 25 MG tablet Take 25 mg by mouth at bedtime.   Yes Historical Provider, MD  apixaban (ELIQUIS) 2.5 MG TABS tablet Take 1 tablet (2.5 mg total) by mouth 2 (two) times daily. 10/28/15  Yes Rhetta MuraJai-Gurmukh Samtani, MD  [employer] VIT D 5000 HIGH-POTENCY 5000 units capsule Take 5,000 Units by mouth daily. 12/22/15  Yes Historical Provider, MD  donepezil (ARICEPT) 5 MG tablet Take 5 mg by mouth at bedtime.   Yes Historical Provider, MD  metoprolol tartrate (LOPRESSOR) 25 MG tablet Take 0.5 tablets (12.5 mg total) by mouth 2 (two) times daily. 02/29/16  Yes Thurmon FairMihai Croitoru, MD  pantoprazole (PROTONIX) 40 MG tablet Take 1 tablet (40 mg total) by mouth daily. 10/28/15  Yes Rhetta Mura, MD  solifenacin (VESICARE) 10 MG tablet Take by mouth daily.   Yes Historical Provider, MD   Physical Exam: Vitals:   11/05/16 1335 11/05/16 1340 11/05/16 1400 11/05/16 1449  BP:  100/80 (!) 124/93   Pulse:    79  Resp: 15 18 15 16   Temp:      TempSrc:      SpO2:   100% 100%  Weight:      Height:        Wt Readings from Last 3 Encounters:  11/05/16 61.7 kg (136 lb)  02/29/16 62.3 kg (137 lb 6.4 oz)  11/18/15 57.7 kg (127 lb 2 oz)    General:   Appears calm, pleasant, disheveled appearing, lying comfortable, NAD, nonvervable, cachetic/thin appearing male. Eyes: PERRL, normal lids, irises & conjunctiva ENT: grossly normal hearing, lips & tongue, dry mmm Neck: no LAD, masses or thyromegaly Cardiovascular: irreg irreg, no m/r/g. No LE edema. No jvd. Telemetry: afib rate controlled Respiratory: diminished diffusely, w/ clear bs though.  no w/r/r. Normal respiratory effort. Abdomen: soft, ntnd, +bs, no g/r Skin: no rash or induration seen on limited exam Musculoskeletal:  Mild muscle atrophy noted all extrem.  Psychiatric: grossly normal mood and affect Neurologic: grossly non-focal.          Labs on Admission:  Basic Metabolic Panel:  Recent Labs Lab 11/05/16 1224  NA 135  K 5.0  CL 103  CO2 17*  GLUCOSE 126*  BUN 50*  CREATININE 1.45*  CALCIUM 8.9   Liver Function Tests:  Recent Labs Lab 11/05/16 1224  AST 34  ALT 27  ALKPHOS 74  BILITOT 1.2  PROT 7.1  ALBUMIN 3.5   No results for input(s): LIPASE, AMYLASE in the last 168 hours. No results for input(s): AMMONIA in the last 168 hours. CBC:  Recent Labs Lab 11/05/16 1224  WBC 12.8*  HGB 18.3*  HCT 55.6*  MCV 94.4  PLT 166   Cardiac Enzymes: No results for input(s): CKTOTAL, CKMB, CKMBINDEX, TROPONINI in the last 168 hours.  BNP (last 3 results)  Recent Labs  11/05/16 1224  BNP 305.4*    ProBNP (last 3 results) No results for input(s): PROBNP in the last 8760 hours.  CBG: No results for input(s): GLUCAP in the last 168 hours.  Radiological Exams on Admission: Dg Chest 2 View  Result Date: 11/05/2016 CLINICAL DATA:  Per family pt has been not feeling well for past 4-5 days. Pt has not been eating for more than a week. Pt c/o shortness of breath. Hx of asthma and COPD. Former smoker. EXAM: CHEST  2 VIEW COMPARISON:  10/23/2015 FINDINGS: Heart is mildly enlarged. There is mild perihilar peribronchial thickening. There are no focal  consolidations or pleural effusions. Known pulmonary edema. IMPRESSION: 1. Mild bronchitic changes. 2.  No focal acute pulmonary abnormality. Electronically Signed   By: Norva Pavlov M.D.   On: 11/05/2016 13:48    EKG: Independently reviewed.   EKG Interpretation  Date/Time:  Sunday November 05 2016 11:53:19 EDT Ventricular Rate:  92 PR Interval:    QRS Duration: 84 QT Interval:  330 QTC Calculation: 408 R Axis:   88 Text Interpretation:  Atrial fibrillation Anteroseptal infarct , age undetermined Abnormal ECG No STEMI.  Confirmed by LONG MD, JOSHUA [phone]) on 11/05/2016 12:17:49 PM        Assessment/Plan Principal Problem:   COPD with  acute exacerbation (HCC) Active Problems:   AKI (acute kidney injury) (HCC)   Chronic atrial fibrillation (HCC)   CAD (coronary artery disease)   Dementia without behavioral disturbance   Failure to thrive (0-17)   1. Copd exacerbation, hx of 60pac-year tob hx. - inpt tele - duonebs, solumedrol 40mg  iv x 1, than po prednisone 20mg  qd afterwards - recent abx Monday - Saturday per family, no obvious signs of infection currently on cxr/afebrile, will hold off on further abx for now.  2. aki w/ dehydration, likely due to decrease po intake - chk ua to r/u uti - gentle ivf hydration  3. Failure to thrive/decrease po intake and generalized weakness -- etiology unclear, perhaps due to advancing stage copd w/ cachexia & dementia - nutrition c/s - PT eval in am - cm/sw to assist w/ dispo, may need hhc vs short term placement - fall precautions,   4. htn -uncontrolled initially, than normalized after fluids/resp trx., stopped all his meds about 3-4 wks ago - will follow, given hydration, hold off on adding rx at this time  5. Hx of cad - denies cp currently. - neg trop, ekg w/ afib  6. Advancing dementia - suspect underlying problem, not currently on any rx. - delirium precautions.  7. Chronic afib, rate controlled - was on eliquis for  time per records, but current off all rx for last 3-4 wks - would hold off on anticoagulation given fall risk at this time   No c/s currently  Code Status: full  (must indicate code status--if unknown or must be presumed, indicate so) DVT Prophylaxis: hep [place] bid Family Communication:  Pt and son/dgt-in-law at bedside. (indicate person spoken with, if applicable, with phone number if by telephone) Disposition Plan: inpt tele (indicate anticipated LOS)  Time spent:  Pete Glatter MD., MBA/MHA [facility] Pager [phone]

## 2016-11-05 NOTE — ED Triage Notes (Signed)
Per family pt has been not feeling well for past 4-5 days. Pt has not been eating for more than a week. Pt c/o shortness of breath.

## 2016-11-05 NOTE — Progress Notes (Signed)
New pt admitted from ED with a complain of SOB, pt is primary [place] language speaking, Son is in bed side with patient for the interpretation, IV fluid started @75cc /hr, steroid IV given , pt ate some of dinner, is incontinent, unable to collect UA, family members are in bed side, aware of safety precaution, yellow arm band and shocks  Provided, will continue to monitor the patient.

## 2016-11-05 NOTE — ED Notes (Signed)
Patient transported to X-ray 

## 2016-11-05 NOTE — ED Provider Notes (Signed)
Emergency Department Provider Note   I have reviewed the triage vital signs and the nursing notes.   HISTORY  Chief Complaint Shortness of Breath   HPI Jesse Barber is a 83 y.o. male with PMH of AAA, a-fib, COPD, SBO, and tobacco use presents to the emergency department for evaluation of generalized fatigue, poor appetite for the past 5 days, associated difficulty breathing that started today. Family states that he has a history of Alzheimer's and over the past 5 days. Very hesitant to eat or drink anything. He stopped taking his medications. His been complaining of generalized fatigue and weakness. No unilateral symptoms. Patient is not on home oxygen. Today he began complaining of shortness of breath and family noticed that he was working much harder to breathe. He refused any albuterol inhalers or other treatments. On arrival to the emergency department the patient is describing some left-sided chest discomfort with unclear onset time quality.  Level 5 caveat: Dementia.    Past Medical History:  Diagnosis Date  . AAA (abdominal aortic aneurysm) (HCC)   . Abnormal EKG 10/25/2015  . AKI (acute kidney injury) (HCC)   . Arthritis   . Asthma   . Atrial fibrillation with rapid ventricular response (HCC) 10/25/2015  . Chronic cor pulmonale (HCC)   . COPD (chronic obstructive pulmonary disease) (HCC)   . Dementia   . Depression   . Elevated troponin 10/25/2015  . Incontinence   . N&V (nausea and vomiting)   . SBO (small bowel obstruction)   . Tobacco abuse disorder     Patient Active Problem List   Diagnosis Date Noted  . COPD with acute exacerbation (HCC) 11/05/2016  . Failure to thrive (0-17) 11/05/2016  . CAD (coronary artery disease) 03/02/2016  . Weight loss, unintentional 03/02/2016  . Hoarseness 03/02/2016  . Chronic atrial fibrillation (HCC) 12/11/2015  . UTI (urinary tract infection) 11/10/2015  . Depression 11/10/2015  . Abnormal EKG 10/25/2015  . Atrial  fibrillation with rapid ventricular response (HCC) 10/25/2015  . Elevated troponin 10/25/2015  . Chronic cor pulmonale (HCC)   . Dementia without behavioral disturbance 10/23/2015  . Urinary frequency 10/23/2015  . Insomnia 10/23/2015  . Smoker 10/23/2015  . Language barrier to communication 10/23/2015  . SBO (small bowel obstruction) 10/23/2015  . COPD (chronic obstructive pulmonary disease) (HCC) 10/23/2015  . AKI (acute kidney injury) (HCC) 10/23/2015  . Nausea and vomiting in adult 10/23/2015  . Tobacco abuse disorder 10/23/2015  . AAA (abdominal aortic aneurysm) without rupture (HCC) 10/23/2015    History reviewed. No pertinent surgical history.    Allergies Patient has no known allergies.  No family history on file.  Social History Social History  Substance Use Topics  . Smoking status: Former Smoker    Packs/day: 1.00    Years: 60.00    Types: Cigarettes    Quit date: 11/13/2015  . Smokeless tobacco: Never Used     Comment: Unlikely to quit.   . Alcohol use No     Comment: Quit drinking 2 years ago    Review of Systems  Constitutional: No fever/chills. Positive generalized weakness.  Eyes: No visual changes. ENT: No sore throat. Cardiovascular: Positive chest pain. Respiratory: Positive SOB and cough.  Gastrointestinal: No abdominal pain. No nausea, no vomiting. No diarrhea.  No constipation. Genitourinary: Negative for dysuria. Musculoskeletal: Negative for back pain. Skin: Negative for rash. Neurological: Negative for headaches, focal weakness or numbness.  10-point ROS otherwise negative.  ____________________________________________   PHYSICAL EXAM:  VITAL  SIGNS: ED Triage Vitals  Enc Vitals Group     BP 11/05/16 1155 (!) 161/84     Pulse Rate 11/05/16 1155 90     Resp 11/05/16 1155 (!) 24     Temp 11/05/16 1158 97.6 F (36.4 C)     Temp Source 11/05/16 1158 Oral     SpO2 11/05/16 1155 (!) 88 %     Weight 11/05/16 1200 136 lb (61.7 kg)      Height 11/05/16 1200 5\' 5"  (1.651 m)     Pain Score 11/05/16 1201 6   Constitutional: Alert and oriented. Chronically ill-appearing.  Eyes: Conjunctivae are normal. PERRL. EOMI. Head: Atraumatic. Nose: No congestion/rhinnorhea. Mouth/Throat: Mucous membranes are dry. Neck: No stridor.  Cardiovascular: Normal rate, regular rhythm. Good peripheral circulation. Grossly normal heart sounds.   Respiratory: Increased espiratory effort.  No retractions. Lungs with wheezing and faint rales at the bases bilaterally.  Gastrointestinal: Soft and nontender. No distention.  Musculoskeletal: No lower extremity tenderness nor edema. No gross deformities of extremities. Neurologic:  Normal speech and language. No gross focal neurologic deficits are appreciated.  Skin:  Skin is warm, dry and intact. No rash noted.  ____________________________________________   LABS (all labs ordered are listed, but only abnormal results are displayed)  Labs Reviewed  BASIC METABOLIC PANEL - Abnormal; Notable for the following:       Result Value   CO2 17 (*)    Glucose, Bld 126 (*)    BUN 50 (*)    Creatinine, Ser 1.45 (*)    GFR calc non Af Amer 42 (*)    GFR calc Af Amer 49 (*)    All other components within normal limits  CBC - Abnormal; Notable for the following:    WBC 12.8 (*)    RBC 5.89 (*)    Hemoglobin 18.3 (*)    HCT 55.6 (*)    All other components within normal limits  BRAIN NATRIURETIC PEPTIDE - Abnormal; Notable for the following:    B Natriuretic Peptide 305.4 (*)    All other components within normal limits  CBC - Abnormal; Notable for the following:    WBC 11.9 (*)    Hemoglobin 17.6 (*)    HCT 53.3 (*)    All other components within normal limits  CREATININE, SERUM - Abnormal; Notable for the following:    Creatinine, Ser 1.63 (*)    GFR calc non Af Amer 37 (*)    GFR calc Af Amer 42 (*)    All other components within normal limits  I-STAT ARTERIAL BLOOD GAS, ED - Abnormal;  Notable for the following:    pCO2 arterial 28.6 (*)    pO2, Arterial 80.0 (*)    Bicarbonate 18.1 (*)    Acid-base deficit 5.0 (*)    All other components within normal limits  HEPATIC FUNCTION PANEL  TSH  BLOOD GAS, ARTERIAL  HEMOGLOBIN A1C  URINALYSIS, ROUTINE W REFLEX MICROSCOPIC  I-STAT TROPOININ, ED   ____________________________________________  EKG   EKG Interpretation  Date/Time:  Sunday November 05 2016 11:53:19 EDT Ventricular Rate:  92 PR Interval:    QRS Duration: 84 QT Interval:  330 QTC Calculation: 408 R Axis:   88 Text Interpretation:  Atrial fibrillation Anteroseptal infarct , age undetermined Abnormal ECG No STEMI.  Confirmed by Kodey Xue MD, Keidan Aumiller [phone]) on 11/05/2016 12:17:49 PM       ____________________________________________  RADIOLOGY  Dg Chest 2 View  Result Date: 11/05/2016 CLINICAL DATA:  Per  family pt has been not feeling well for past 4-5 days. Pt has not been eating for more than a week. Pt c/o shortness of breath. Hx of asthma and COPD. Former smoker. EXAM: CHEST  2 VIEW COMPARISON:  10/23/2015 FINDINGS: Heart is mildly enlarged. There is mild perihilar peribronchial thickening. There are no focal consolidations or pleural effusions. Known pulmonary edema. IMPRESSION: 1. Mild bronchitic changes. 2.  No focal acute pulmonary abnormality. Electronically Signed   By: Norva PavlovElizabeth  Brown M.D.   On: 11/05/2016 13:48    ____________________________________________   PROCEDURES  Procedure(s) performed:   Procedures  None ____________________________________________   INITIAL IMPRESSION / ASSESSMENT AND PLAN / ED COURSE  Pertinent labs & imaging results that were available during my care of the patient were reviewed by me and considered in my medical decision making (see chart for details).  Patient resents to the emergency department for evaluation of difficulty breathing, generalized fatigue, poor oral intake. My exam he has cyanosis of the  hands and fingers that improves with nasal cannula oxygen. Patient with faint expiratory wheezing on exam and some rails bases. No known history of congestive heart failure. He does have history of pulmonary hypertension, chronic A. Fib, and COPD. Plan for labs, troponin, CXR, and reassessment. Will Give Duoneb in the interim.   Patient with some mild AKI and new O2 requirement. Looking better after Duoneb treatment.   Discussed patient's case with hospitalist. Patient and family (if present) updated with plan. Care transferred to hospitalist service.  I reviewed all nursing notes, vitals, pertinent old records, EKGs, labs, imaging (as available).  ____________________________________________  FINAL CLINICAL IMPRESSION(S) / ED DIAGNOSES  Final diagnoses:  AKI (acute kidney injury) (HCC)  Shortness of breath     MEDICATIONS GIVEN DURING THIS VISIT:  Medications  heparin injection 5,000 Units (not administered)  sodium chloride flush (NS) 0.9 % injection 3 mL (3 mLs Intravenous Given 11/05/16 1641)  0.9 %  sodium chloride infusion ( Intravenous New Bag/Given 11/05/16 1640)  bisacodyl (DULCOLAX) suppository 10 mg (not administered)  senna-docusate (Senokot-S) tablet 1 tablet (not administered)  ondansetron (ZOFRAN) tablet 4 mg (not administered)    Or  ondansetron (ZOFRAN) injection 4 mg (not administered)  ipratropium-albuterol (DUONEB) 0.5-2.5 (3) MG/3ML nebulizer solution 3 mL (3 mLs Nebulization Not Given 11/05/16 1629)  albuterol (PROVENTIL) (2.5 MG/3ML) 0.083% nebulizer solution 2.5 mg (not administered)  predniSONE (DELTASONE) tablet 20 mg (not administered)  ipratropium-albuterol (DUONEB) 0.5-2.5 (3) MG/3ML nebulizer solution 3 mL (3 mLs Nebulization Given 11/05/16 1240)  sodium chloride 0.9 % bolus 500 mL (0 mLs Intravenous Stopped 11/05/16 1500)  0.9 %  sodium chloride infusion ( Intravenous Stopped 11/05/16 1501)  methylPREDNISolone sodium succinate (SOLU-MEDROL) 40 mg/mL  injection 40 mg (40 mg Intravenous Given 11/05/16 1642)     NEW OUTPATIENT MEDICATIONS STARTED DURING THIS VISIT:  None   Note:  This document was prepared using Dragon voice recognition software and may include unintentional dictation errors.  Alona BeneJoshua Tatijana Bierly, MD Emergency Medicine   Maia PlanJoshua G Alekxander Isola, MD 11/05/16 [phone]

## 2016-11-06 DIAGNOSIS — N179 Acute kidney failure, unspecified: Secondary | ICD-10-CM | POA: Diagnosis not present

## 2016-11-06 DIAGNOSIS — J441 Chronic obstructive pulmonary disease with (acute) exacerbation: Secondary | ICD-10-CM | POA: Diagnosis not present

## 2016-11-06 DIAGNOSIS — R0602 Shortness of breath: Secondary | ICD-10-CM | POA: Diagnosis not present

## 2016-11-06 LAB — URINALYSIS, ROUTINE W REFLEX MICROSCOPIC
BILIRUBIN URINE: NEGATIVE
GLUCOSE, UA: NEGATIVE mg/dL
KETONES UR: NEGATIVE mg/dL
Leukocytes, UA: NEGATIVE
NITRITE: NEGATIVE
PH: 5 (ref 5.0–8.0)
PROTEIN: 30 mg/dL — AB
SPECIFIC GRAVITY, URINE: 1.019 (ref 1.005–1.030)
Squamous Epithelial / LPF: NONE SEEN

## 2016-11-06 LAB — HEMOGLOBIN A1C
HEMOGLOBIN A1C: 6 % — AB (ref 4.8–5.6)
Mean Plasma Glucose: 126 mg/dL

## 2016-11-06 MED ORDER — APIXABAN 2.5 MG PO TABS
2.5000 mg | ORAL_TABLET | Freq: Two times a day (BID) | ORAL | Status: DC
Start: 2016-11-06 — End: 2016-11-07
  Administered 2016-11-06 – 2016-11-07 (×3): 2.5 mg via ORAL
  Filled 2016-11-06 (×3): qty 1

## 2016-11-06 MED ORDER — IPRATROPIUM-ALBUTEROL 0.5-2.5 (3) MG/3ML IN SOLN
3.0000 mL | Freq: Three times a day (TID) | RESPIRATORY_TRACT | Status: DC
  Administered 2016-11-06 – 2016-11-07 (×4): 3 mL via RESPIRATORY_TRACT
  Filled 2016-11-06 (×4): qty 3

## 2016-11-06 MED ORDER — ENSURE ENLIVE PO LIQD
237.0000 mL | Freq: Two times a day (BID) | ORAL | Status: DC
  Administered 2016-11-06 (×2): 237 mL via ORAL

## 2016-11-06 MED ORDER — ADULT MULTIVITAMIN W/MINERALS CH
1.0000 | ORAL_TABLET | Freq: Every day | ORAL | Status: DC
  Administered 2016-11-06 – 2016-11-07 (×2): 1 via ORAL
  Filled 2016-11-06 (×2): qty 1

## 2016-11-06 MED ORDER — BOOST / RESOURCE BREEZE PO LIQD
1.0000 | ORAL | Status: DC
  Administered 2016-11-07: 1 via ORAL

## 2016-11-06 MED ORDER — ORAL CARE MOUTH RINSE
15.0000 mL | Freq: Two times a day (BID) | OROMUCOSAL | Status: DC
  Administered 2016-11-06 – 2016-11-07 (×2): 15 mL via OROMUCOSAL

## 2016-11-06 MED ORDER — METHYLPREDNISOLONE SODIUM SUCC 40 MG IJ SOLR
40.0000 mg | Freq: Two times a day (BID) | INTRAMUSCULAR | Status: DC
  Administered 2016-11-06 (×2): 40 mg via INTRAVENOUS
  Filled 2016-11-06 (×2): qty 1

## 2016-11-06 NOTE — Evaluation (Signed)
Clinical/Bedside Swallow Evaluation Patient Details  Name: Jesse Barber MRN: [id number] Date of Birth: 07-Dec-1928  Today's Date: 11/06/2016 Time: SLP Start Time (ACUTE ONLY): 1001 SLP Stop Time (ACUTE ONLY): 1025 SLP Time Calculation (min) (ACUTE ONLY): 24 min  Past Medical History:  Past Medical History:  Diagnosis Date  . AAA (abdominal aortic aneurysm) (HCC)   . Abnormal EKG 10/25/2015  . AKI (acute kidney injury) (HCC)   . Arthritis   . Asthma   . Atrial fibrillation with rapid ventricular response (HCC) 10/25/2015  . Chronic cor pulmonale (HCC)   . COPD (chronic obstructive pulmonary disease) (HCC)   . Dementia   . Depression   . Elevated troponin 10/25/2015  . Incontinence   . N&V (nausea and vomiting)   . SBO (small bowel obstruction)   . Tobacco abuse disorder    Past Surgical History: History reviewed. No pertinent surgical history. HPI:  83 year old male, [place] speaking, with H/o dementia, admitted with COPD exacerbation, AKI with dehydration, failure to thrive with generalized weakness and decreased po intake.    Assessment / Plan / Recommendation Clinical Impression  Patient presents with what appears to be a normal oropharyngeal swallow with intact airway protection. No f/u SLP services indicated at this time.  SLP Visit Diagnosis: Dysphagia, unspecified (R13.10)    Aspiration Risk  Mild aspiration risk    Diet Recommendation Dysphagia 3 (Mech soft);Thin liquid (loose dentition)   Liquid Administration via: Cup;Straw Medication Administration: Whole meds with liquid Supervision: Patient able to self feed Compensations: Slow rate;Small sips/bites Postural Changes: Seated upright at 90 degrees    Other  Recommendations Oral Care Recommendations: Oral care BID   Follow up Recommendations None        Swallow Study   General HPI: 83 year old male, [place] speaking, with H/o dementia, admitted with COPD exacerbation, AKI with dehydration, failure to thrive  with generalized weakness and decreased po intake.  Type of Study: Bedside Swallow Evaluation Previous Swallow Assessment: none Diet Prior to this Study: Thin liquids;Dysphagia 3 (soft) Temperature Spikes Noted: No Respiratory Status: Nasal cannula History of Recent Intubation: No Behavior/Cognition: Alert;Cooperative;Pleasant mood Oral Cavity Assessment: Dry Oral Cavity - Dentition: Dentures, top;Dentures, bottom (loose) Vision: Functional for self-feeding Self-Feeding Abilities: Able to feed self Patient Positioning: Upright in bed Baseline Vocal Quality: Normal Volitional Cough: Strong Volitional Swallow: Able to elicit    Oral/Motor/Sensory Function Overall Oral Motor/Sensory Function: Within functional limits   Ice Chips Ice chips: Not tested   Thin Liquid Thin Liquid: Within functional limits Presentation: Cup;Self Fed;Straw    Nectar Thick Nectar Thick Liquid: Not tested   Honey Thick Honey Thick Liquid: Not tested   Puree Puree: Within functional limits Presentation: Self Fed;Spoon   Solid   Jesse Mclaren MA, CCC-SLP [phone]    Solid: Within functional limits Presentation: Self Fed        Jesse Barber Jesse Barber 11/06/2016,10:31 AM

## 2016-11-06 NOTE — Evaluation (Signed)
Physical Therapy Evaluation Patient Details Name: Jesse Barber MRN: [id number] DOB: 1929/07/06 Today's Date: 11/06/2016   History of Present Illness  83 y.o. male [facility] for SOB/DOE.  Pt dx with COPD exacerbation, AKI with dehydration, FTT, and HTN. Pt with significant PMHx of incontinence, dementia, COPD, chronic cor pulmonaleA fib with RVR, and AAA.  Clinical Impression  Pt is weak and deconditioned with significant activity intolerance and inability to even at rest be on RA without O2 dropping.  Pt only walked a short distance in his room and HR 90-140s, O2 sats on 4 L O2 [place] 97%, on RA at rest in the bed was 87%.  Son present and agreeable that he could handle him physically at home, but not sure if he is medically ready yet, MD to weigh in on this based on his activity tolerance.   PT to follow acutely for deficits listed below.       Follow Up Recommendations Home health PT;Supervision for mobility/OOB    Equipment Recommendations  Other (comment) (possibly home O2)    Recommendations for Other Services   NA    Precautions / Restrictions Precautions Precautions: Fall;Other (comment) Precaution Comments: monitor O2 sats and wean as able.       Mobility  Bed Mobility Overal bed mobility: Needs Assistance Bed Mobility: Supine to Sit     Supine to sit: Min assist;HOB elevated     General bed mobility comments: Min hand held assist to get to EOB.   Transfers Overall transfer level: Needs assistance Equipment used: Straight cane Transfers: Sit to/from Stand Sit to Stand: Min assist         General transfer comment: Min assist to support trunk during transitions over weak legs. inital tendancy for posterior LOB.   Ambulation/Gait Ambulation/Gait assistance: Min assist Ambulation Distance (Feet): 25 Feet Assistive device: Straight cane Gait Pattern/deviations: Step-through pattern;Staggering left;Staggering right Gait velocity: decreased    General Gait Details: Pt with staggering/shuffling gait pattern with cane, reaching for things with his free hand.  Due to his dementia, I don't know if we could inrtroduce a RW successfully, but may be worth a try next session.  Pt reaching for things for stability with his free hand.  Min assist at trunk for balance.          Balance Overall balance assessment: Needs assistance Sitting-balance support: Feet supported;No upper extremity supported Sitting balance-Leahy Scale: Fair     Standing balance support: Bilateral upper extremity supported;Single extremity supported Standing balance-Leahy Scale: Poor                               Pertinent Vitals/Pain Pain Assessment: No/denies pain    Home Living Family/patient expects to be discharged to:: Private residence Living Arrangements: Children Available Help at Discharge: Family;Available 24 hours/day Type of Home: House Home Access: Stairs to enter Entrance Stairs-Rails: None Entrance Stairs-Number of Steps: 2 Home Layout: One level Home Equipment: Cane - single point      Prior Function Level of Independence: Needs assistance   Gait / Transfers Assistance Needed: Per son, pt was walking with cane both inside and outside of the home, he doesn't drive.               Extremity/Trunk Assessment   Upper Extremity Assessment Upper Extremity Assessment: Generalized weakness    Lower Extremity Assessment Lower Extremity Assessment: Generalized weakness    Cervical / Trunk Assessment  Cervical / Trunk Assessment: Normal  Communication   Communication: Prefers language other than English  Cognition Arousal/Alertness: Awake/alert Behavior During Therapy: WFL for tasks assessed/performed Overall Cognitive Status: History of cognitive impairments - at baseline                                        General Comments General comments (skin integrity, edema, etc.): Per RN HR got up into  the 140s during gait.  We were unable to remove O2 for gait, but his O2 sats got better with 4 L O2 [place] while walking than in the bed.      Exercises General Exercises - Upper Extremity Shoulder Flexion: AROM;Both;10 reps Elbow Flexion: AROM;Both;10 reps General Exercises - Lower Extremity Ankle Circles/Pumps: AROM;Both;10 reps Long Arc Quad: AROM;Both;10 reps Hip Flexion/Marching: AROM;Both;10 reps   Assessment/Plan    PT Assessment Patient needs continued PT services  PT Problem List Decreased balance;Decreased activity tolerance;Decreased mobility;Decreased knowledge of use of DME;Cardiopulmonary status limiting activity       PT Treatment Interventions DME instruction;Gait training;Stair training;Functional mobility training;Therapeutic activities;Balance training;Therapeutic exercise;Patient/family education    PT Goals (Current goals can be found in the Care Plan section)  Acute Rehab PT Goals Patient Stated Goal: to get him home and strong enough to return to his home country to live out the rest of his days.  PT Goal Formulation: With patient/family Time For Goal Achievement: 11/20/16 Potential to Achieve Goals: Good    Frequency Min 3X/week           End of Session Equipment Utilized During Treatment: Gait belt;Oxygen Activity Tolerance: Patient limited by fatigue;Other (comment) (by increased HR during gait) Patient left: in chair;with call bell/phone within reach;with chair alarm set;with family/visitor present Nurse Communication: Mobility status PT Visit Diagnosis: Unsteadiness on feet (R26.81);Muscle weakness (generalized) (M62.81);Other (comment) (decreased activity tolerance. )    Time: 6962-9528 PT Time Calculation (min) (ACUTE ONLY): 29 min   Charges:   PT Evaluation $PT Eval Moderate Complexity: 1 Procedure PT Treatments $Gait Training: 8-22 mins          Aldred Mase B. Anadelia Kintz, PT, DPT [phone]   11/06/2016, 7:07 PM

## 2016-11-06 NOTE — Progress Notes (Signed)
Pt remain stable with HR currently at 94. Up in chair son Kathlene NovemberMike at bedside.  Notified Dr. Jomarie LongsJoseph of HR 140 with RVR. Pt asymptomatic.  Will continue to monitor.  Amanda PeaNellie Seabron Iannello, RN

## 2016-11-06 NOTE — Progress Notes (Signed)
Initial Nutrition Assessment  DOCUMENTATION CODES:   Non-severe (moderate) malnutrition in context of acute illness/injury  INTERVENTION:  Ensure Enlive po once BID after meals, each supplement provides 350 kcal and 20 grams of protein Boost Breeze po once daily, each supplement provides 250 kcal and 9 grams of protein Magic Cup ice cream once daily, provides 290 kcal and 9 grams of protein   NUTRITION DIAGNOSIS:   Inadequate oral intake related to lethargy/confusion, poor appetite as evidenced by per patient/family report, mild depletion of body fat, mild depletion of muscle mass, energy intake < 75% for > 7 days.   GOAL:   Patient will meet greater than or equal to 90% of their needs   MONITOR:   PO intake, Supplement acceptance, Labs, Weight trends, Skin  REASON FOR ASSESSMENT:   Consult Assessment of nutrition requirement/status  ASSESSMENT:   [age] [place] with a history of smoking, dementia, COPD, mild depression in the past, chronic atrial fibrillation, CAD and chronic cor pulmonale by history, presented to our ED for further workup secondary to acute onset shortness of breath and dyspnea on exertion and decreased PO intake, failure to thrive  Pt's family at bedside provided history. They report that patient usually eats fairly well, but has not been eating anything for the past week and has been drinking very little. Lunch tray at bedside with a few bites taken and ~ 3 ounces of iced tea. Family states that this is the most he has eaten in the past week. They feel the pt has lost a few pounds, but are unsure how much; they think pt usually weighs 135 lbs. Note: SLP recommended Dysphagia 3 diet due to loose dentition.  RD recommended trying to improve calorie and protein intake via a variety of supplements. Family agreeable to pt trying things. RD assessed pt's food preferences and will add to meal ordering system.   Labs: decreased GFR, elevated hemoglobin, elevated  BUN   Diet Order:  DIET SOFT Room service appropriate? Yes; Fluid consistency: Thin  Skin:  Reviewed, no issues  Last BM:  3/24  Height:   Ht Readings from Last 1 Encounters:  11/05/16 5\' 4"  (1.626 m)    Weight:   Wt Readings from Last 1 Encounters:  11/06/16 132 lb 14.4 oz (60.3 kg)    Ideal Body Weight:  59.09 kg  BMI:  Body mass index is 22.81 kg/m.  Estimated Nutritional Needs:   Kcal:  1500-1800  Protein:  75-90 grams  Fluid:  1.5-1.8 L/day  EDUCATION NEEDS:   No education needs identified at this time  Dorothea Ogleeanne Deidrick Rainey RD, LDN, CSP Inpatient Clinical Dietitian Pager: [phone] After Hours Pager: [phone]

## 2016-11-06 NOTE — Care Management Note (Addendum)
Case Management Note  Patient Details  Name: Jesse Barber MRN: [id number] Date of Birth: May 14, 1929  Subjective/Objective:   Admitted with COPD                Action/Plan: Patient lives with his family; PCP: Merrilee SeashoreAnne Alexander, MD; has private insurance with Medicare /Medicaid with prescription drug coverage; CM talked to pt son, offered HHC choices, [facility]; Eber JonesCarolyn with [facility] called for arrangements; Also CM gave son a list of personal care services for additional help in the home; DME - cane  3/27 Addendum Lawerance Sabalebbie Marche Hottenstein RN CM [facility] [phone], verified Epic Access and referral, updated of expected DC today. Provided daughter with application for PCS through Medicaid. Instructed to call number on medicaid card to get new PCP and card as she stated office listed closed.   Expected Discharge Date:    possibly 11/09/2016              Expected Discharge Plan:  Home w Home Health Services  Discharge planning Services  CM Consult  HH Arranged:  RN, Disease Management, PT, Nurse's Aide HH [facility]  Status of Service:  In process, will continue to follow  Cherrie DistanceChandler, Brenda L, RN ,MHA,BSN [phone] 11/06/2016, 10:07 AM

## 2016-11-06 NOTE — Progress Notes (Signed)
Pt heart rate 140 in RVR per central monitor while pt up with PT.  Checked HR 103 and BP 149/87, P 103. Pt asymptomatic. Will continue to monitor.  Jesse Barber, Charity fundraiserN.

## 2016-11-06 NOTE — Progress Notes (Signed)
Pt is having a restful night with Son at the bedside, No distress/uneventful night vitals stable.

## 2016-11-06 NOTE — Progress Notes (Signed)
PROGRESS NOTE    Jesse Barber  VOZ:[id number] DOB: [birth year] DOA: 11/05/2016 PCP: Inocencio Homes, MD  Brief Narrative:Jesse Barber is a 55 [place] male with a history of smoking, dementia, COPD, mild depression in the past, chronic atrial fibrillation, CAD and chronic cor pulmonale by history, presented to our ED for further workup secondary to acute onset shortness of breath and dyspnea on exertion and decreased PO intake, failure to thrive At baseline he is alert awake and communicative and fairly independent despite his dementia. Chest x-ray unremarkable, improving with treatment for COPD exacerbation   Assessment & Plan:   1. Copd exacerbation, hx of 60pac-year tob hx. -Improving, continue IV Solu-Medrol today -Continue nebulizers, wean oxygen, chest x-ray unremarkable -Recently treated with antibiotics hence will hold off on another course  2. AKI w/ dehydration, likely due to decrease po intake -IVF, will continue this today, check B met in a.m.  3. Failure to thrive/decrease po intake and generalized weakness -- Per family much improved this morning,  despite underlying copd w/ cachexia & dementia - nutrition consult, will have physical therapy evaluate  -discussed with son and he is interested in finding help for the patient at home -Case management and social work consult  4. HTN -stable now, monitor  5. Hx of cad - denies cp currently. - neg troponin, ekg w/ afib  6. Advancing dementia - suspect underlying problem, not currently on any rx. - delirium precautions.  7. Chronic afib, rate controlled - was on eliquis for time per records, but current off all rx for last 3-4 wks - resume same  Code Status: full  DVT Prophylaxis: resume eliquis Family Communication:  son at bedside Disposition Plan: home in 1-2days    Antimicrobials:      Subjective: Per son he feels much better and is breathing matter better more comfortable,  Objective: Vitals:   11/05/16 2153 11/06/16 0109 11/06/16 0611 11/06/16 0936  BP: 140/84 (!) 151/71 136/79   Pulse: 87 88 87   Resp: 18 18 16    Temp: 97.9 F (36.6 C)  98.2 F (36.8 C)   TempSrc: Oral  Oral   SpO2: 97% 98% 98% 96%  Weight:   60.3 kg (132 lb 14.4 oz)   Height:        Intake/Output Summary (Last 24 hours) at 11/06/16 1130 Last data filed at 11/06/16 0900  Gross per 24 hour  Intake          1531.25 ml  Output              350 ml  Net          1181.25 ml   Filed Weights   11/05/16 1200 11/05/16 1557 11/06/16 0611  Weight: 61.7 kg (136 lb) 59.9 kg (132 lb) 60.3 kg (132 lb 14.4 oz)    Examination:  General exam: Elderly male laying in bed, no distress, alert awake Respiratory system: Poor air movement, rare expiratory wheezes in upper lobes Cardiovascular system: S1 & S2 heard, RRR. No JVD, murmurs, rubs, gallops or clicks. No pedal edema. Gastrointestinal system: Abdomen is nondistended, soft and nontender.  Normal bowel sounds heard. Central nervous system: Alert and oriented. No focal neurological deficits. Extremities: Symmetric 5 x 5 power. Skin: No rashes, lesions or ulcers Psychiatry: flat affect    Data Reviewed:   CBC:  Recent Labs Lab 11/05/16 1224 11/05/16 1516  WBC 12.8* 11.9*  HGB 18.3* 17.6*  HCT 55.6* 53.3*  MCV 94.4 93.0  PLT 166 180  Basic Metabolic Panel:  Recent Labs Lab 11/05/16 1224 11/05/16 1516  NA 135  --   K 5.0  --   CL 103  --   CO2 17*  --   GLUCOSE 126*  --   BUN 50*  --   CREATININE 1.45* 1.63*  CALCIUM 8.9  --    GFR: Estimated Creatinine Clearance: 27.2 mL/min (A) (by C-G formula based on SCr of 1.63 mg/dL (H)). Liver Function Tests:  Recent Labs Lab 11/05/16 1224  AST 34  ALT 27  ALKPHOS 74  BILITOT 1.2  PROT 7.1  ALBUMIN 3.5   No results for input(s): LIPASE, AMYLASE in the last 168 hours. No results for input(s): AMMONIA in the last 168 hours. Coagulation Profile: No results for input(s): INR, PROTIME in the  last 168 hours. Cardiac Enzymes: No results for input(s): CKTOTAL, CKMB, CKMBINDEX, TROPONINI in the last 168 hours. BNP (last 3 results) No results for input(s): PROBNP in the last 8760 hours. HbA1C:  Recent Labs  11/05/16 1614  HGBA1C 6.0*   CBG: No results for input(s): GLUCAP in the last 168 hours. Lipid Profile: No results for input(s): CHOL, HDL, LDLCALC, TRIG, CHOLHDL, LDLDIRECT in the last 72 hours. Thyroid Function Tests:  Recent Labs  11/05/16 1614  TSH 1.806   Anemia Panel: No results for input(s): VITAMINB12, FOLATE, FERRITIN, TIBC, IRON, RETICCTPCT in the last 72 hours. Urine analysis:    Component Value Date/Time   COLORURINE YELLOW 11/06/2016 0154   APPEARANCEUR CLEAR 11/06/2016 0154   LABSPEC 1.019 11/06/2016 0154   PHURINE 5.0 11/06/2016 0154   GLUCOSEU NEGATIVE 11/06/2016 0154   HGBUR SMALL (A) 11/06/2016 0154   BILIRUBINUR NEGATIVE 11/06/2016 0154   KETONESUR NEGATIVE 11/06/2016 0154   PROTEINUR 30 (A) 11/06/2016 0154   NITRITE NEGATIVE 11/06/2016 0154   LEUKOCYTESUR NEGATIVE 11/06/2016 0154   Sepsis Labs: @LABRCNTIP (procalcitonin:4,lacticidven:4)  )No results found for this or any previous visit (from the past 240 hour(s)).       Radiology Studies: Dg Chest 2 View  Result Date: 11/05/2016 CLINICAL DATA:  Per family pt has been not feeling well for past 4-5 days. Pt has not been eating for more than a week. Pt c/o shortness of breath. Hx of asthma and COPD. Former smoker. EXAM: CHEST  2 VIEW COMPARISON:  10/23/2015 FINDINGS: Heart is mildly enlarged. There is mild perihilar peribronchial thickening. There are no focal consolidations or pleural effusions. Known pulmonary edema. IMPRESSION: 1. Mild bronchitic changes. 2.  No focal acute pulmonary abnormality. Electronically Signed   By: Nolon Nations M.D.   On: 11/05/2016 13:48        Scheduled Meds: . heparin  5,000 Units Subcutaneous Q8H  . ipratropium-albuterol  3 mL Nebulization TID   . methylPREDNISolone (SOLU-MEDROL) injection  40 mg Intravenous Q12H  . sodium chloride flush  3 mL Intravenous Q12H   Continuous Infusions: . sodium chloride 75 mL/hr at 11/06/16 0449     LOS: 1 day    Time spent: 66mn    PDomenic Polite MD [facility] Pager [phone] If 7PM-7AM, please contact night-coverage [email or web address] Password [email or web address] 11/06/2016, 11:30 AM

## 2016-11-06 NOTE — Progress Notes (Signed)
ANTICOAGULATION CONSULT NOTE - Initial Consult  Pharmacy Consult for Eliquis Indication: atrial fibrillation  No Known Allergies  Patient Measurements: Height: 5\' 4"  (162.6 cm) Weight: 132 lb 14.4 oz (60.3 kg) (a scale) IBW/kg (Calculated) : 59.2  Vital Signs: Temp: 98.2 F (36.8 C) (03/26 0611) Temp Source: Oral (03/26 0611) BP: 136/79 (03/26 0611) Pulse Rate: 87 (03/26 0611)  Labs:  Recent Labs  11/05/16 1224 11/05/16 1516  HGB 18.3* 17.6*  HCT 55.6* 53.3*  PLT 166 180  CREATININE 1.45* 1.63*    Estimated Creatinine Clearance: 27.2 mL/min (A) (by C-G formula based on SCr of 1.63 mg/dL (H)).   Medical History: Past Medical History:  Diagnosis Date  . AAA (abdominal aortic aneurysm) (HCC)   . Abnormal EKG 10/25/2015  . AKI (acute kidney injury) (HCC)   . Arthritis   . Asthma   . Atrial fibrillation with rapid ventricular response (HCC) 10/25/2015  . Chronic cor pulmonale (HCC)   . COPD (chronic obstructive pulmonary disease) (HCC)   . Dementia   . Depression   . Elevated troponin 10/25/2015  . Incontinence   . N&V (nausea and vomiting)   . SBO (small bowel obstruction)   . Tobacco abuse disorder    Assessment:   83 yr old male to resume Eliquis for afib.  Held on admission, has been on SQ heparin for VTE prophylaxis.     88 yrs, weight right at 60 kg, creatinine up to 1.63 today.   Was on Eliquis 2.5 mg BID prior to admission, though had not taken all meds for about 3-4 weeks prior to admission.   Goal of Therapy:  therapeutic anticoagulation Monitor platelets by anticoagulation protocol: Yes   Plan:   Discontinue sq heparin.  Resume Eliquis 2.5 mg BID.  Intermittent CBC.  Dennie Fettersgan, Theresea Trautmann Donovan, [place] Pager: [phone] 11/06/2016,12:01 PM

## 2016-11-07 DIAGNOSIS — F0390 Unspecified dementia without behavioral disturbance: Secondary | ICD-10-CM | POA: Diagnosis not present

## 2016-11-07 DIAGNOSIS — R0602 Shortness of breath: Secondary | ICD-10-CM | POA: Diagnosis not present

## 2016-11-07 DIAGNOSIS — J441 Chronic obstructive pulmonary disease with (acute) exacerbation: Secondary | ICD-10-CM | POA: Diagnosis not present

## 2016-11-07 DIAGNOSIS — I482 Chronic atrial fibrillation: Secondary | ICD-10-CM | POA: Diagnosis not present

## 2016-11-07 DIAGNOSIS — N179 Acute kidney failure, unspecified: Secondary | ICD-10-CM | POA: Diagnosis not present

## 2016-11-07 DIAGNOSIS — E440 Moderate protein-calorie malnutrition: Secondary | ICD-10-CM | POA: Insufficient documentation

## 2016-11-07 LAB — BASIC METABOLIC PANEL
Anion gap: 10 (ref 5–15)
BUN: 39 mg/dL — ABNORMAL HIGH (ref 6–20)
CO2: 20 mmol/L — AB (ref 22–32)
Calcium: 8.2 mg/dL — ABNORMAL LOW (ref 8.9–10.3)
Chloride: 107 mmol/L (ref 101–111)
Creatinine, Ser: 1.3 mg/dL — ABNORMAL HIGH (ref 0.61–1.24)
GFR calc Af Amer: 56 mL/min — ABNORMAL LOW (ref >60)
GFR calc non Af Amer: 48 mL/min — ABNORMAL LOW (ref >60)
GLUCOSE: 139 mg/dL — AB (ref 65–99)
POTASSIUM: 5 mmol/L (ref 3.5–5.1)
Sodium: 137 mmol/L (ref 135–145)

## 2016-11-07 LAB — CBC
HEMATOCRIT: 46.6 % (ref 39.0–52.0)
Hemoglobin: 15.2 g/dL (ref 13.0–17.0)
MCH: 30.3 pg (ref 26.0–34.0)
MCHC: 32.6 g/dL (ref 30.0–36.0)
MCV: 93 fL (ref 78.0–100.0)
Platelets: 217 10*3/uL (ref 150–400)
RBC: 5.01 MIL/uL (ref 4.22–5.81)
RDW: 15.6 % — AB (ref 11.5–15.5)
WBC: 16.3 10*3/uL — AB (ref 4.0–10.5)

## 2016-11-07 MED ORDER — ALBUTEROL SULFATE HFA 108 (90 BASE) MCG/ACT IN AERS
2.0000 | INHALATION_SPRAY | Freq: Four times a day (QID) | RESPIRATORY_TRACT | 1 refills | Status: AC | PRN
Start: 2016-11-07 — End: ?

## 2016-11-07 MED ORDER — PREDNISONE 20 MG PO TABS: 20.0000 mg | ORAL_TABLET | Freq: Every day | ORAL | 0 refills | Status: AC

## 2016-11-07 MED ORDER — PREDNISONE 50 MG PO TABS
50.0000 mg | ORAL_TABLET | Freq: Every day | ORAL | Status: DC
  Administered 2016-11-07: 50 mg via ORAL
  Filled 2016-11-07: qty 1

## 2016-11-07 NOTE — Progress Notes (Signed)
Patient discharged to home with all his belongings.  Family here to pick up.  No complaints at the time of discharge. Elnita MaxwellSalome N Finnlee Guarnieri, RN

## 2016-11-07 NOTE — Clinical Social Work Note (Signed)
CSW acknowledges consult for disposition needs. Patient is discharging home today.  CSW signing off.  Charlynn CourtSarah Sherin Murdoch, CSW [phone]

## 2016-11-07 NOTE — Discharge Instructions (Signed)

## 2016-11-07 NOTE — Discharge Summary (Signed)
Physician Discharge Summary  Jesse Barber ZOX:[id number] DOB: 23-May-1929 DOA: 11/05/2016  PCP: Merrilee Seashore, MD  Admit date: 11/05/2016 Discharge date: 11/07/2016  Time spent: 35 minutes  Recommendations for Outpatient Follow-up:  1. PCP in 1 week 2. Home health PT/OT/RN/Aide   Discharge Diagnoses:  Principal Problem:   COPD with acute exacerbation (HCC) Active Problems:   Dementia without behavioral disturbance   AKI (acute kidney injury) (HCC)   Chronic atrial fibrillation (HCC)   CAD (coronary artery disease)   Failure to thrive (0-17)   Malnutrition of moderate degree   Discharge Condition: stable  Diet recommendation: heart healthy  Filed Weights   11/05/16 1557 11/06/16 0611 11/07/16 0451  Weight: 59.9 kg (132 lb) 60.3 kg (132 lb 14.4 oz) 61.5 kg (135 lb 8 oz)    History of present illness:  Jesse Jasnicis a 49 [place] with a history of smoking, dementia, COPD, mild depression in the past, chronic atrial fibrillation, CAD and chronic cor pulmonale by history, presented to our ED for further workup secondary to acute onset shortness of breath and dyspnea on exertion and decreased PO intake, failure to thrive At baseline he is alert awake and communicative and fairly independent despite his dementia. Chest x-ray unremarkable, improving with treatment for COPD exacerbation  Hospital Course:  1. Copd exacerbation, hx of 60pac-year tob hx. -Improved with IV Solu-Medrol, nebs, O2 -Continue nebulizers, weaned off oxygen, chest x-ray unremarkable -Recently treated with antibiotics hence will hold off on another course -discharged home on a prednisone taper  2. AKI w/ dehydration, likely due to decrease po intake -improved with hydration to 1.3 at discharge  3. Failure to thrive/decrease po intake and generalized weakness -- Per family much improved now with Rx for above, eating improved with improvement in Resp status - nutrition consulted, PT eval completed,  home health set up at discharge -Case management and social work consulted, home health services set up, will down Hospice down the road when he deteriorates again  4. HTN -stable now, monitor  5. Hx of cad - stable, no chest pain  6. Advancing dementia - suspect underlying problem, not currently on any rx.  7. Chronic afib, rate controlled - was on eliquis for time per records, but current off all rx for last 3-4 wks - resumed   Discharge Exam: Vitals:   11/07/16 0454 11/07/16 1000  BP: (!) 151/79 140/89  Pulse: 85 74  Resp: 20   Temp: 98.1 F (36.7 C)     General: Alert, awake, pleasant Cardiovascular: S1S2/RRR Respiratory: improved air movement  Discharge Instructions   Discharge Instructions    Discharge instructions    Complete by:  As directed    Dysphagia 3 diet with thin liquids   Increase activity slowly    Complete by:  As directed      Discharge Medication List as of 11/07/2016 12:13 PM    START taking these medications   Details  albuterol (PROVENTIL HFA;VENTOLIN HFA) 108 (90 Base) MCG/ACT inhaler Inhale 2 puffs into the lungs every 6 (six) hours as needed for wheezing or shortness of breath., Starting Tue 11/07/2016, Print    predniSONE (DELTASONE) 20 MG tablet Take 1-2 tablets (20-40 mg total) by mouth daily with breakfast. Take 40mg  for 2days then 20mg  for 2days then STOP, Starting Tue 11/07/2016, Print      CONTINUE these medications which have NOT CHANGED   Details  amitriptyline (ELAVIL) 25 MG tablet Take 25 mg by mouth at bedtime., Historical Med  apixaban (ELIQUIS) 2.5 MG TABS tablet Take 1 tablet (2.5 mg total) by mouth 2 (two) times daily., Starting Thu 10/28/2015, Normal    [employer] VIT D 5000 HIGH-POTENCY 5000 units capsule Take 5,000 Units by mouth daily., Starting Wed 12/22/2015, Historical Med    donepezil (ARICEPT) 5 MG tablet Take 5 mg by mouth at bedtime., Historical Med    metoprolol tartrate (LOPRESSOR) 25 MG tablet Take 0.5  tablets (12.5 mg total) by mouth 2 (two) times daily., Starting Tue 02/29/2016, Normal    pantoprazole (PROTONIX) 40 MG tablet Take 1 tablet (40 mg total) by mouth daily., Starting Thu 10/28/2015, Normal      STOP taking these medications     solifenacin (VESICARE) 10 MG tablet        No Known Allergies Follow-up Information    [facility].   Specialty:  Home Health Services Why:  They will do your home health care in your home Contact information: [address] [place] [id number] [phone]            The results of significant diagnostics from this hospitalization (including imaging, microbiology, ancillary and laboratory) are listed below for reference.    Significant Diagnostic Studies: Dg Chest 2 View  Result Date: 11/05/2016 CLINICAL DATA:  Per family pt has been not feeling well for past 4-5 days. Pt has not been eating for more than a week. Pt c/o shortness of breath. Hx of asthma and COPD. Former smoker. EXAM: CHEST  2 VIEW COMPARISON:  10/23/2015 FINDINGS: Heart is mildly enlarged. There is mild perihilar peribronchial thickening. There are no focal consolidations or pleural effusions. Known pulmonary edema. IMPRESSION: 1. Mild bronchitic changes. 2.  No focal acute pulmonary abnormality. Electronically Signed   By: Norva PavlovElizabeth  Brown M.D.   On: 11/05/2016 13:48    Microbiology: No results found for this or any previous visit (from the past 240 hour(s)).   Labs: Basic Metabolic Panel:  Recent Labs Lab 11/05/16 1224 11/05/16 1516 11/07/16 0503  NA 135  --  137  K 5.0  --  5.0  CL 103  --  107  CO2 17*  --  20*  GLUCOSE 126*  --  139*  BUN 50*  --  39*  CREATININE 1.45* 1.63* 1.30*  CALCIUM 8.9  --  8.2*   Liver Function Tests:  Recent Labs Lab 11/05/16 1224  AST 34  ALT 27  ALKPHOS 74  BILITOT 1.2  PROT 7.1  ALBUMIN 3.5   No results for input(s): LIPASE, AMYLASE in the last 168 hours. No results for input(s): AMMONIA in the last 168  hours. CBC:  Recent Labs Lab 11/05/16 1224 11/05/16 1516 11/07/16 0503  WBC 12.8* 11.9* 16.3*  HGB 18.3* 17.6* 15.2  HCT 55.6* 53.3* 46.6  MCV 94.4 93.0 93.0  PLT 166 180 217   Cardiac Enzymes: No results for input(s): CKTOTAL, CKMB, CKMBINDEX, TROPONINI in the last 168 hours. BNP: BNP (last 3 results)  Recent Labs  11/05/16 1224  BNP 305.4*    ProBNP (last 3 results) No results for input(s): PROBNP in the last 8760 hours.  CBG: No results for input(s): GLUCAP in the last 168 hours.     SignedZannie Cove:  Sylis Ketchum MD.  [facility] 11/07/2016, 2:50 PM

## 2016-11-08 ENCOUNTER — Telehealth: Payer: Self-pay | Admitting: *Deleted

## 2016-11-08 NOTE — Telephone Encounter (Signed)
[facility] and listed Dr. Lyn HollingsheadAlexander as patient's PCP. Dr. Lyn HollingsheadAlexander hasn't seen patient since 12/2015 at skilled nursing facility where patient was discharged. Informed Kenney Housemananya that this was not our patient and she would have to get orders from patient's PCP. She agreed.

## 2016-11-22 NOTE — Progress Notes (Signed)
   11/06/16 1858  PT G-Codes **NOT FOR INPATIENT CLASS**  Functional Assessment Tool Used AM-PAC 6 Clicks Basic Mobility  Functional Limitation Mobility: Walking and moving around  Mobility: Walking and Moving Around Current Status (Z6109) CL  Mobility: Walking and Moving Around Goal Status (U0454) CJ  12/14/2016 late entry g-codes. Jesse Barber, PT, DPT [phone]

## 2016-11-22 NOTE — Progress Notes (Signed)
   11/06/16 1000  SLP Visit Information  SLP Received On 11/06/16  General Information  HPI 83 year old male, [place] speaking, with H/o dementia, admitted with COPD exacerbation, AKI with dehydration, failure to thrive with generalized weakness and decreased po intake.   Type of Study Bedside Swallow Evaluation  Previous Swallow Assessment none  Diet Prior to this Study Thin liquids;Dysphagia 3 (soft)  Temperature Spikes Noted No  Respiratory Status Nasal cannula  History of Recent Intubation No  Behavior/Cognition Alert;Cooperative;Pleasant mood  Oral Cavity Assessment Dry  Oral Cavity - Dentition Dentures, top;Dentures, bottom (loose)  Vision Functional for self-feeding  Self-Feeding Abilities Able to feed self  Patient Positioning Upright in bed  Baseline Vocal Quality Normal  Volitional Cough Strong  Volitional Swallow Able to elicit  Pain Assessment  Pain Assessment No/denies pain  Oral Motor/Sensory Function  Overall Oral Motor/Sensory Function WFL  Ice Chips  Ice chips NT  Thin Liquid  Thin Liquid WFL  Presentation Cup;Self Fed;Straw  Nectar Thick Liquid  Nectar Thick Liquid NT  Honey Thick Liquid  Honey Thick Liquid NT  Puree  Puree WFL  Presentation Self Fed;Spoon  Solid  Solid WFL  Presentation Self Fed  SLP - End of Session  Patient left in bed;with call bell/phone within reach;with family/visitor present  Nurse Communication Diet recommendation  SLP Assessment  Clinical Impression Statement (ACUTE ONLY) Patient presents with what appears to be a normal oropharyngeal swallow with intact airway protection. No f/u SLP services indicated at this time.   SLP Visit Diagnosis Dysphagia, unspecified (R13.10)  Impact on safety and function Mild aspiration risk  Other Related Risk Factors Cognitive impairment  Swallow Evaluation Recommendations  SLP Diet Recommendations Dysphagia 3 (Mech soft);Thin liquid (loose dentition)  Liquid Administration via Cup;Straw   Medication Administration Whole meds with liquid  Supervision Patient able to self feed  Compensations Slow rate;Small sips/bites  Postural Changes Seated upright at 90 degrees  Treatment Plan  Oral Care Recommendations Oral care BID  Treatment Recommendations No treatment recommended at this time  Follow up Recommendations None  Individuals Consulted  Consulted and Agree with Results and Recommendations Patient;Family member/caregiver  Family Member Consulted son  SLP Time Calculation  SLP Start Time (ACUTE ONLY) 1001  SLP Stop Time (ACUTE ONLY) 1025  SLP Time Calculation (min) (ACUTE ONLY) 24 min  SLP G-Codes **NOT FOR INPATIENT CLASS**  Functional Assessment Tool Used skilled clinical judgement  Functional Limitations Swallowing  Swallow Current Status (F0932) CI  Swallow Goal Status (T5573) CI  Swallow Discharge Status (U2025) CI  SLP Evaluations  $ SLP Speech Visit 1 Procedure  SLP Evaluations  $BSS Swallow 1 Procedure  Ferdinand Lango MA, CCC-SLP [phone]

## 2018-02-13 IMAGING — CR DG ABDOMEN ACUTE W/ 1V CHEST
4 series · 4 of 4 positions shown · non-contrast
Comparison: No priors.

CLINICAL DATA: 86-year-old male with history of abdominal pain.

EXAM:
DG ABDOMEN ACUTE W/ 1V CHEST

[PA]
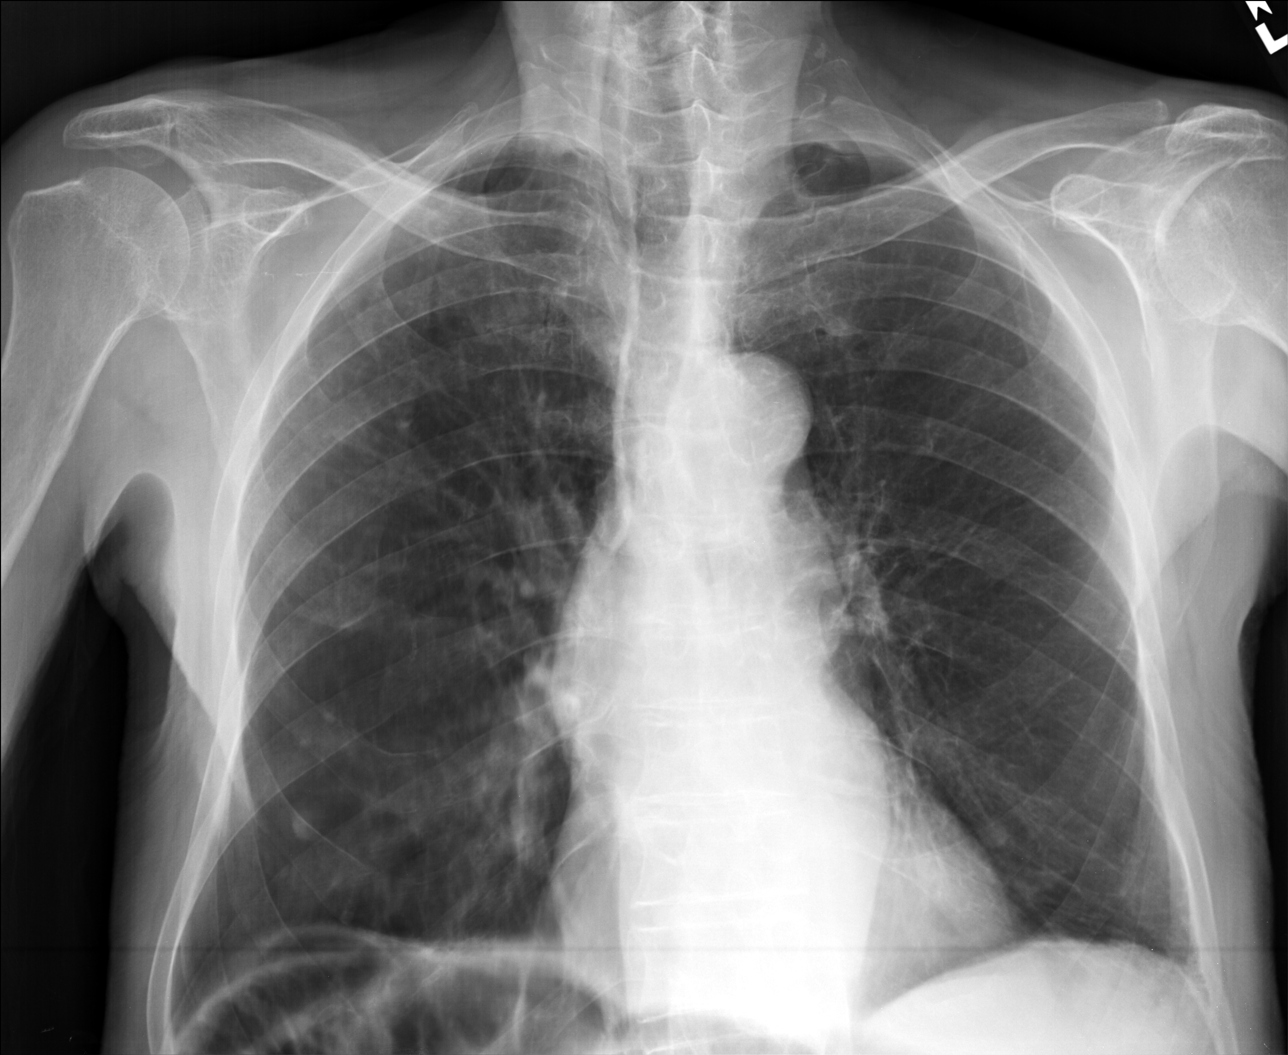

[AP (1 of 3)]
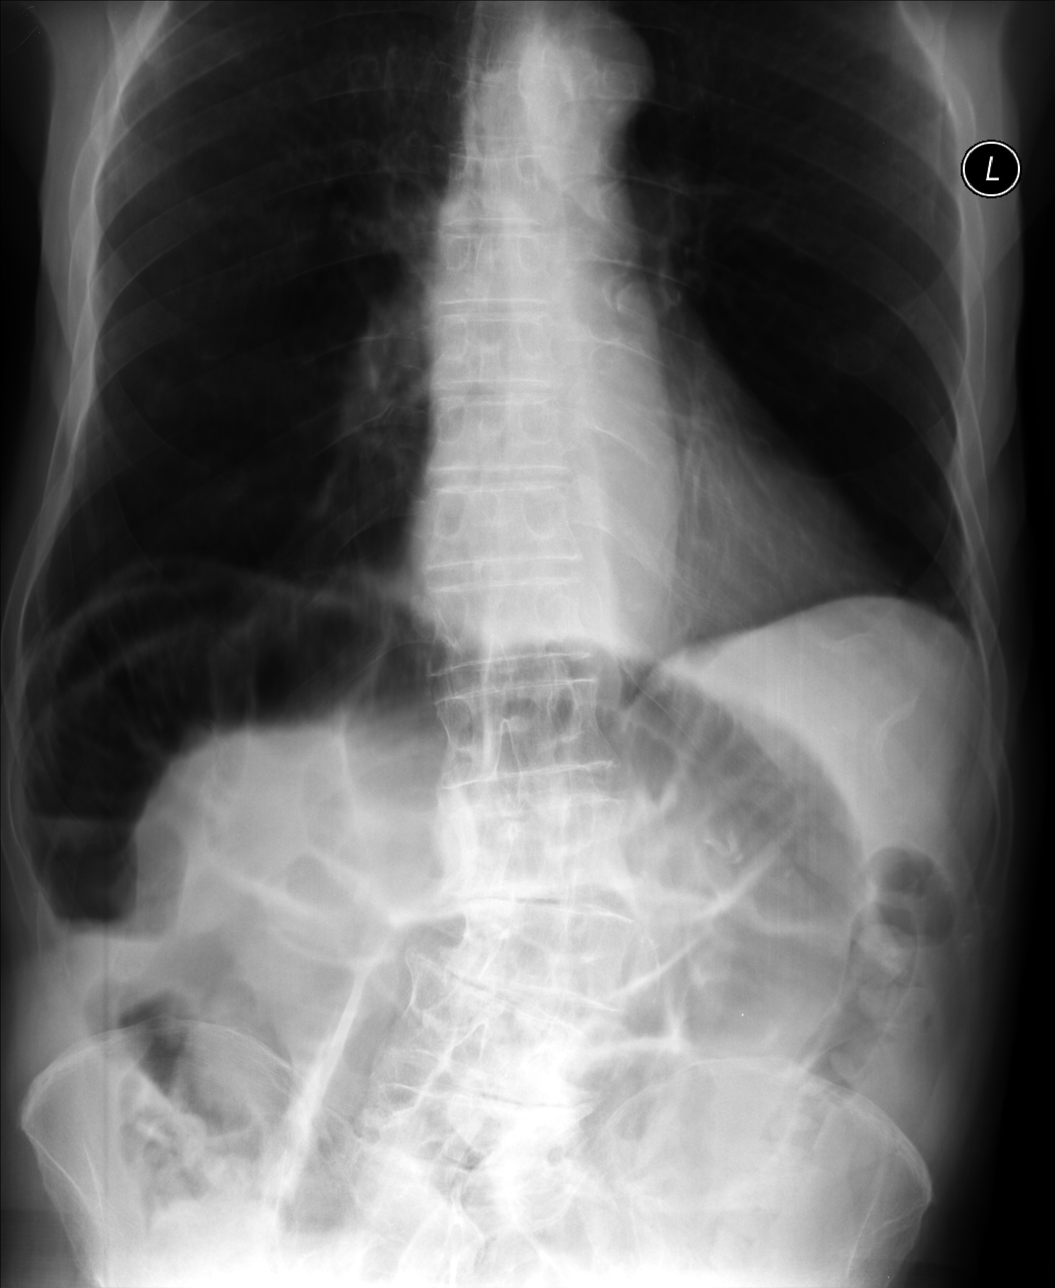

[AP (2 of 3)]
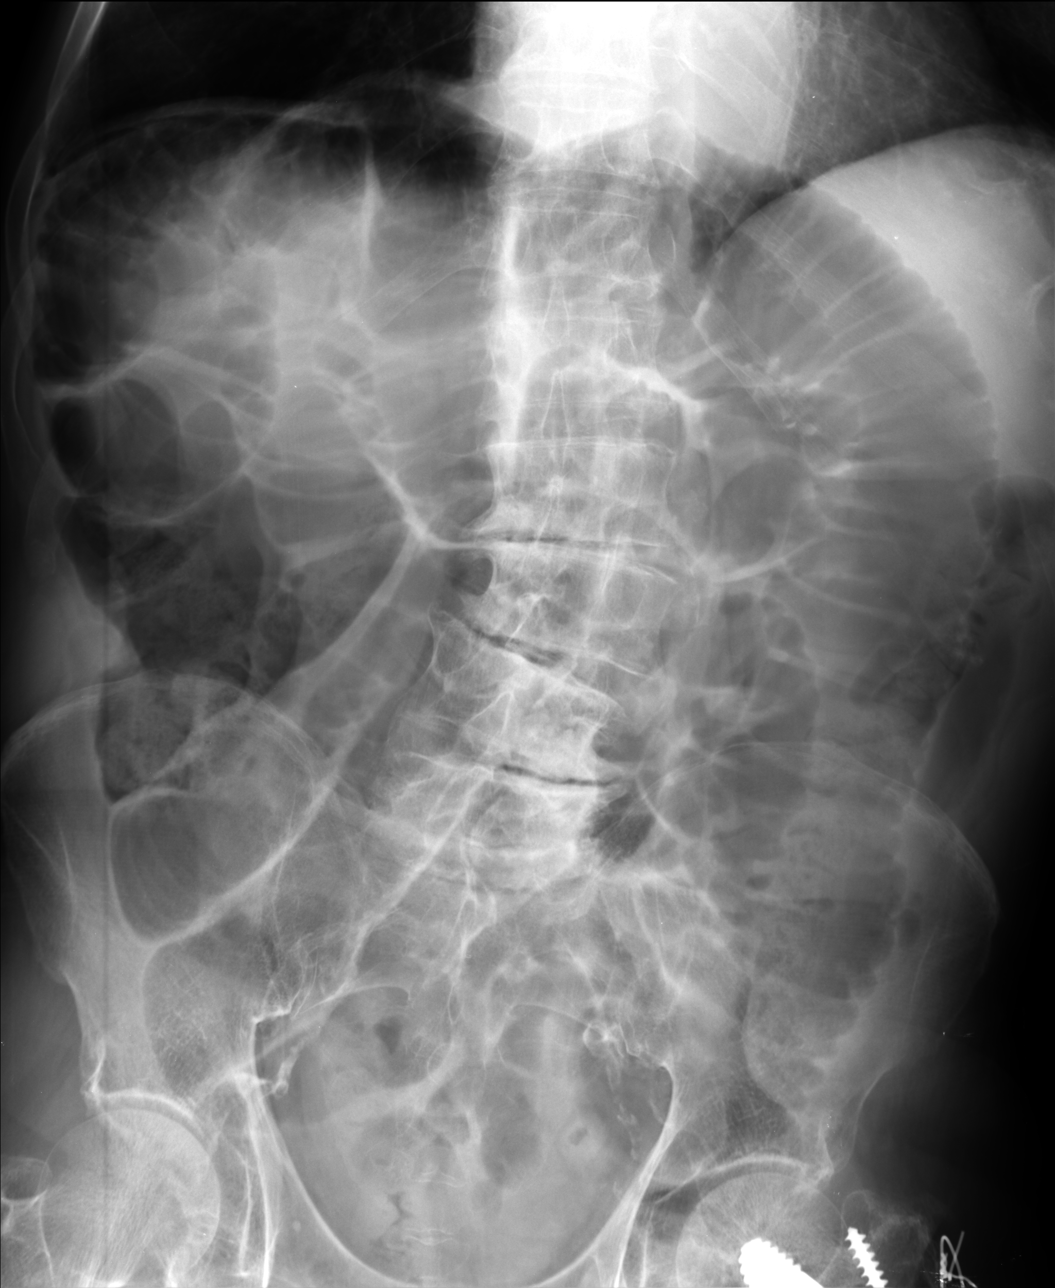

[AP (3 of 3)]
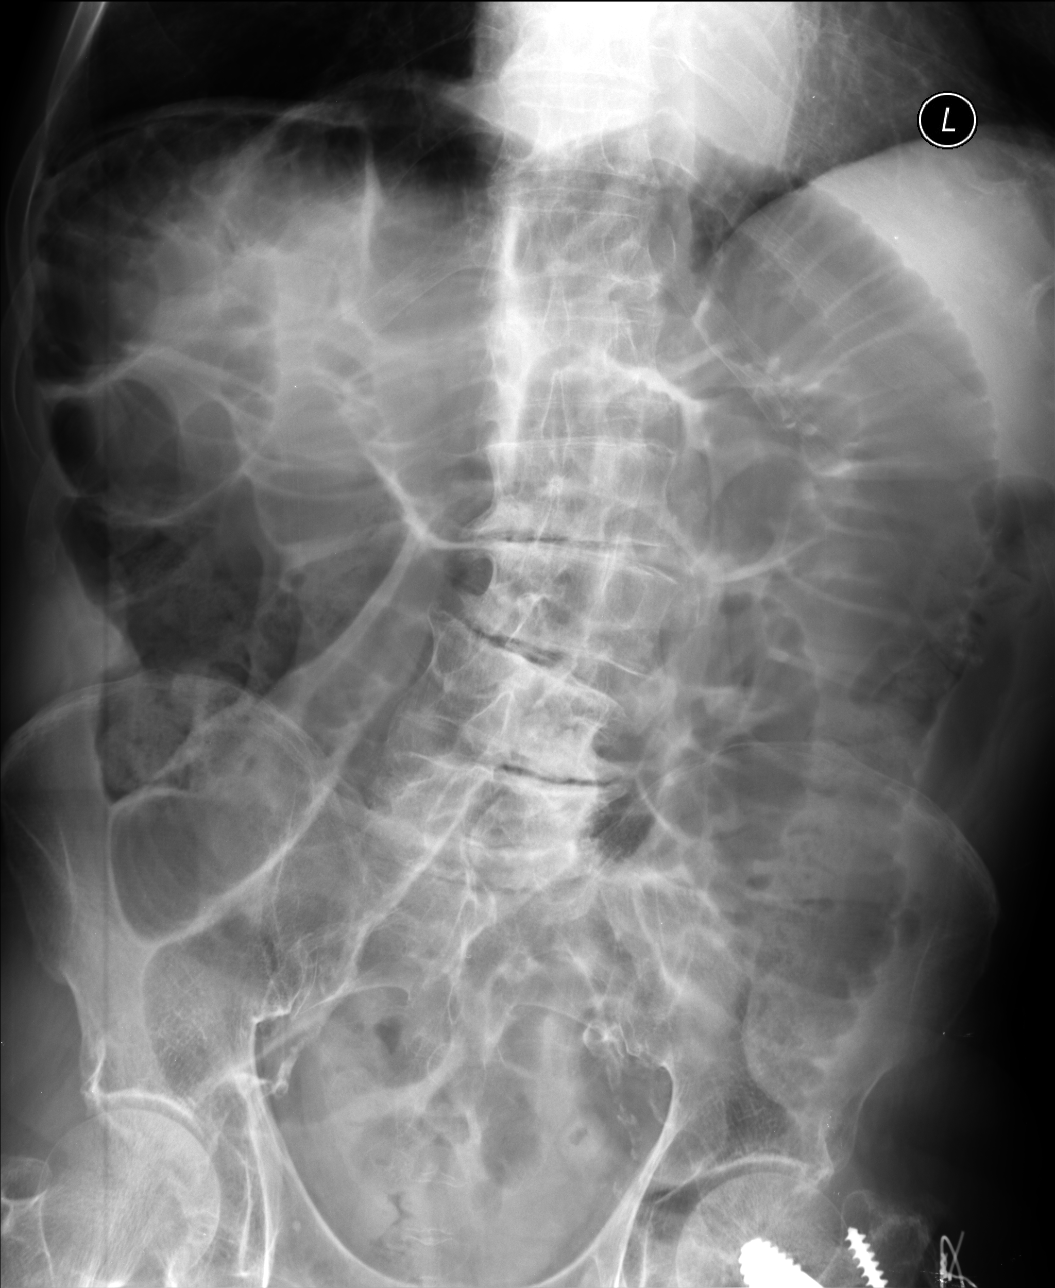

[4 of 4 positions shown; findings below may reference images not displayed]

FINDINGS: Several small dense nodules in the right hemithorax are most
compatible with calcified granulomas. Bilateral nipple shadows
incidentally noted. No acute consolidative airspace disease. No
pleural effusions. No evidence of pulmonary edema. No pneumothorax.
Heart size is normal. Upper mediastinal contours are within normal
limits. Atherosclerosis in the thoracic aorta.

Numerous dilated loops of small bowel measuring up to 6.3 cm in
diameter. There is a small amount of colonic and rectal gas and
stool noted. No frank pneumoperitoneum confidently identified.
Orthopedic fixation hardware in the left hip incompletely visualize.
Numerous vascular calcifications are noted.
IMPRESSION: 1. Appearance of the abdomen is compatible with small bowel
obstruction, as above. Given the presence of gas and stool in the
colon, obstruction may be early, or could be partial.
2. No pneumoperitoneum.
3. No radiographic evidence of acute cardiopulmonary disease.
4. Atherosclerosis.

## 2018-02-14 IMAGING — CR DG ABD PORTABLE 1V
2 series · 2 of 2 positions shown · non-contrast
Comparison: CT abdomen pelvis dated 10/23/2015

CLINICAL DATA: Small bowel obstruction

EXAM:
PORTABLE ABDOMEN - 1 VIEW

[AP (1 of 2)]
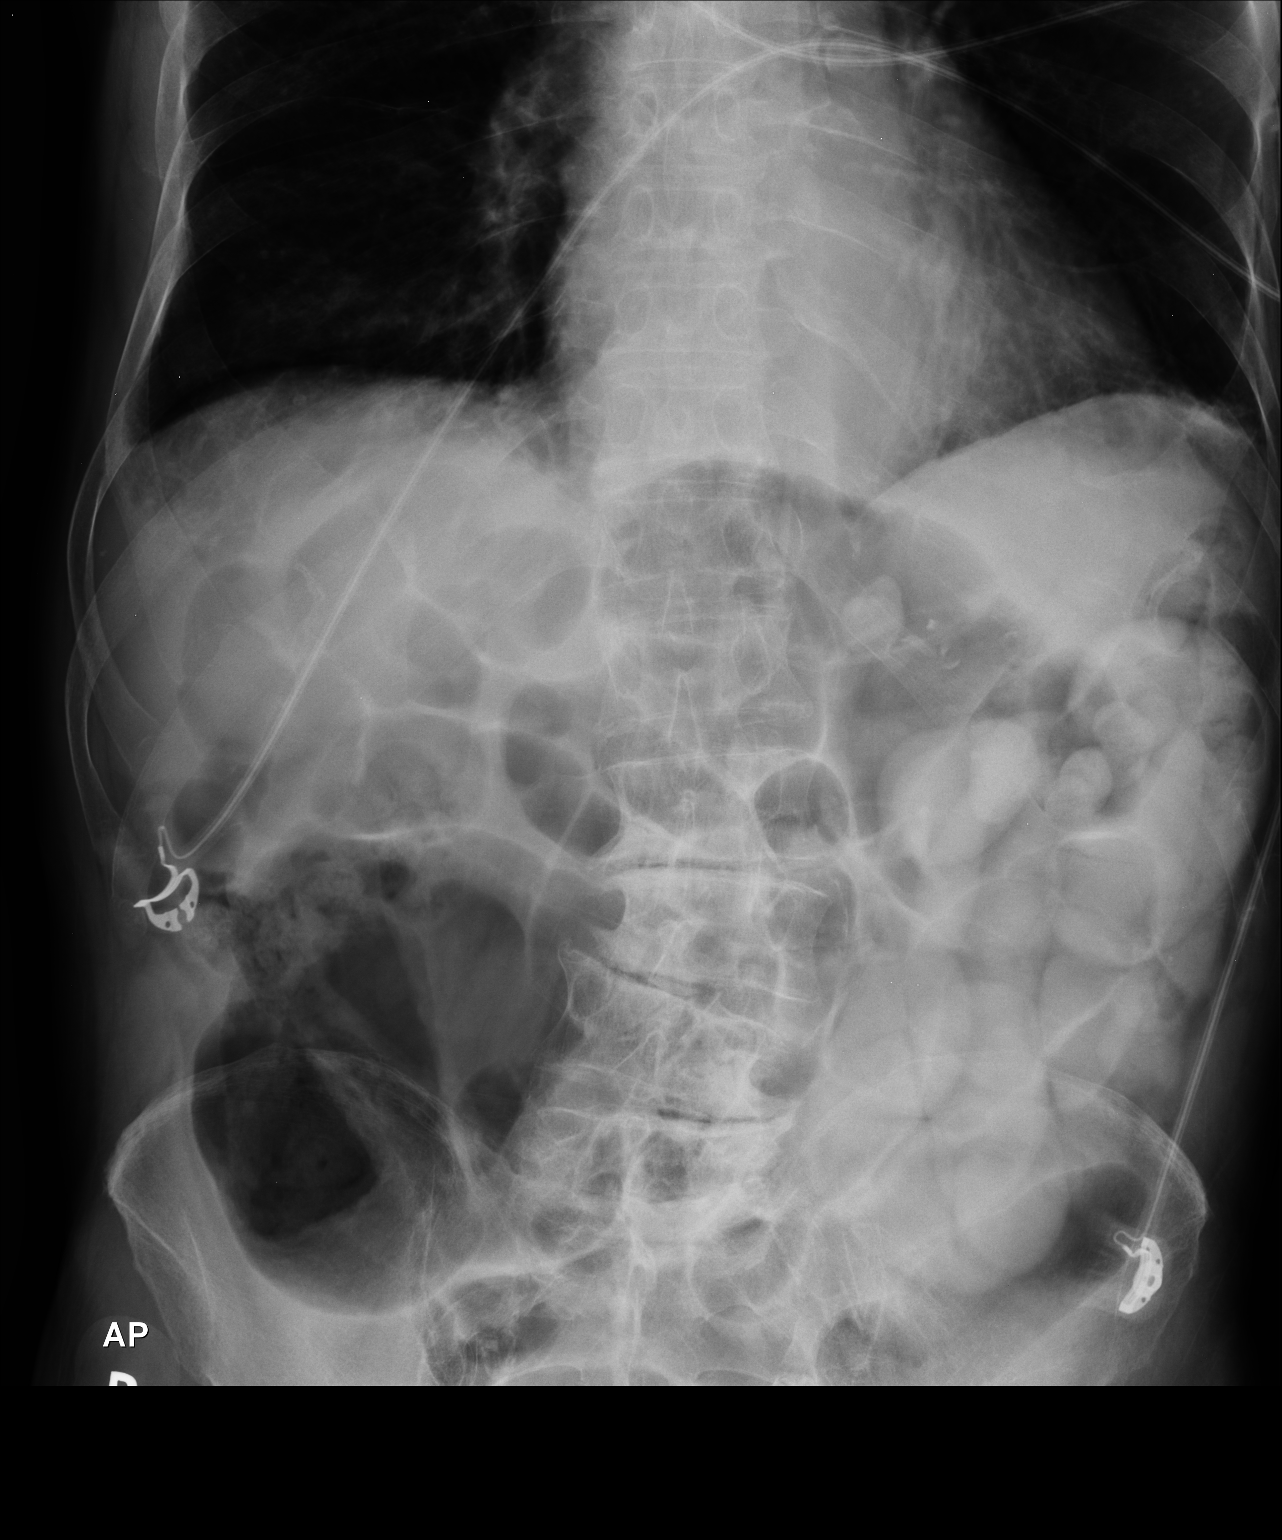

[AP (2 of 2)]
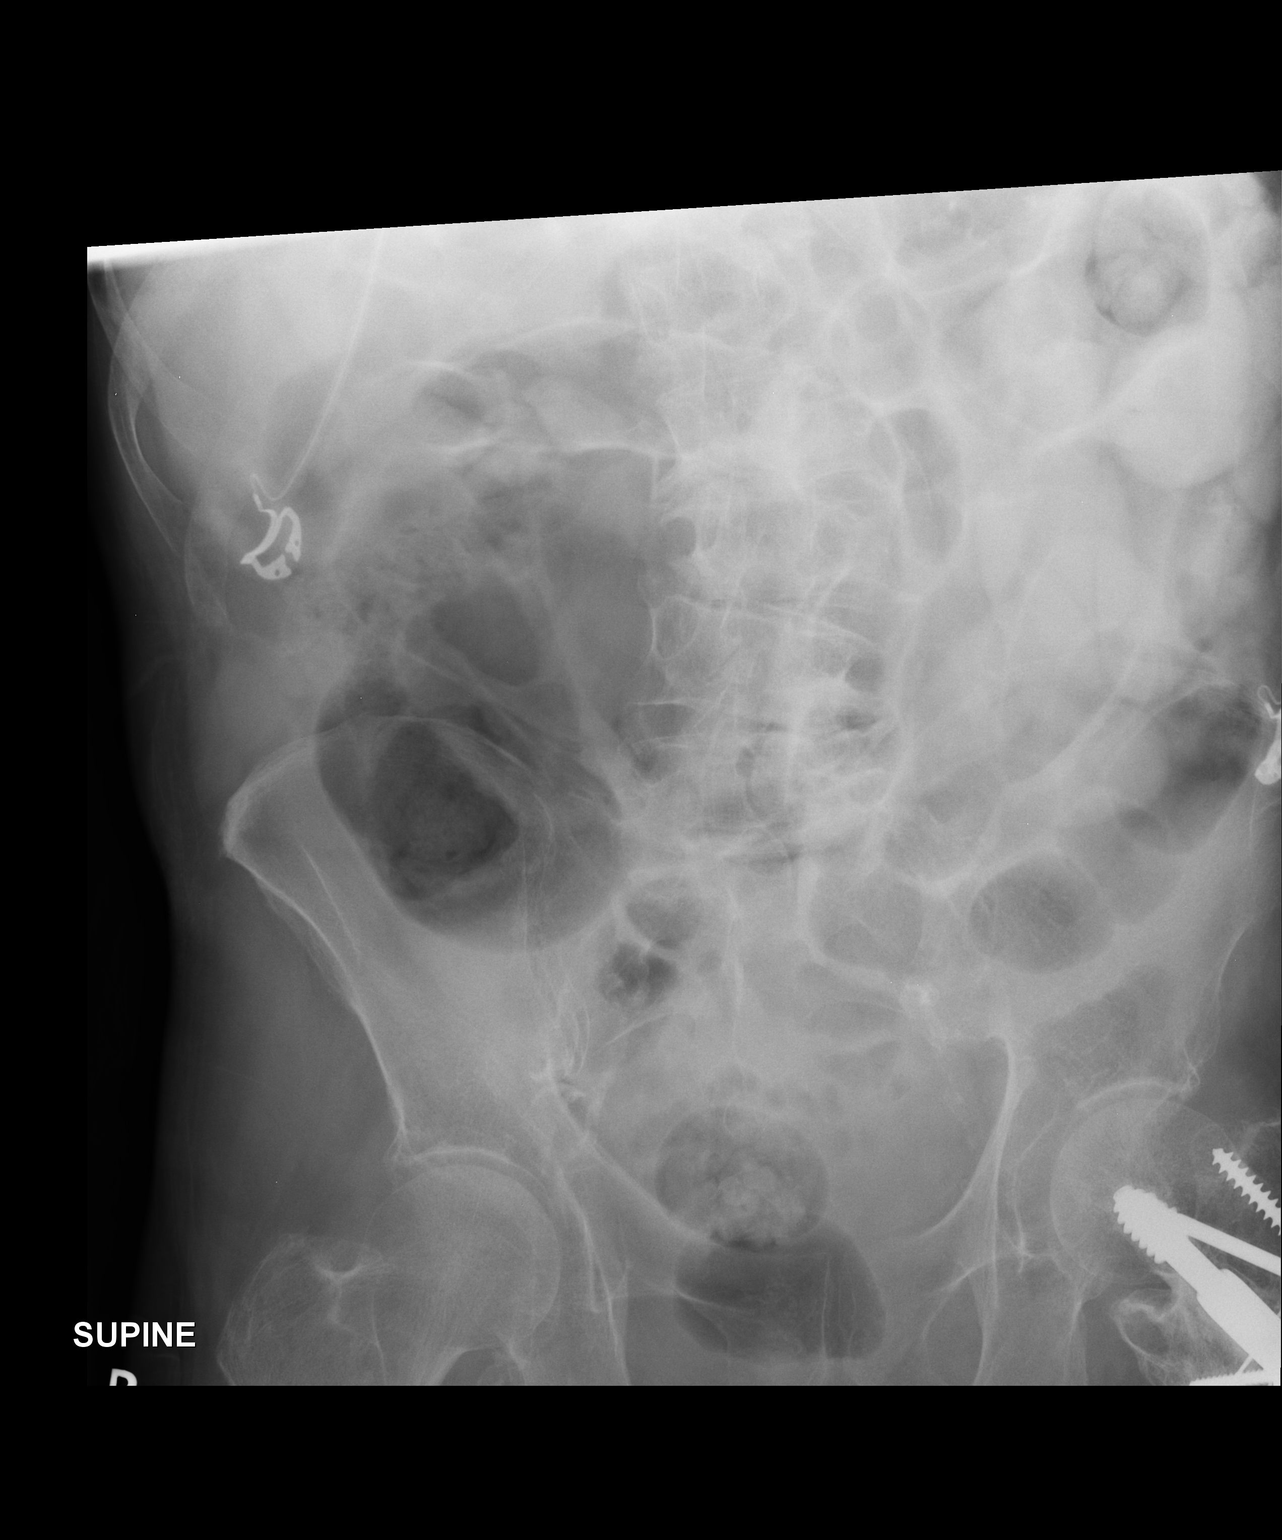

[2 of 2 positions shown; findings below may reference images not displayed]

FINDINGS: Dilated loops of small bowel in the central abdomen, including
opacified loops of small bowel in the left mid abdomen. Contrast has
not yet reached the colon.

Degenerative changes of the lumbar spine.

Status post ORIF of the left hip
IMPRESSION: Dilated loops of small bowel, as above, compatible with small bowel
obstruction. Contrast has not yet reached the colon.

## 2018-02-14 IMAGING — RF DG NASO G TUBE PLC W/FL-NO RAD
1 series · 1 of 1 positions shown · non-contrast
Comparison: Abdominal radiograph dated 10/24/2015 at 7585 hours

CLINICAL DATA: NG tube placement, small bowel obstruction

EXAM:
DG NASO G TUBE LORRAINE JIM/OK
FLUOROSCOPY TIME:  Radiation Exposure Index (as provided by the
fluoroscopic device): 1.6 mGy

[Series 1: fluoro_iodine_singleshot_bw · 0.19mm/px · 1 of 1 slices shown]
[im 1/1]
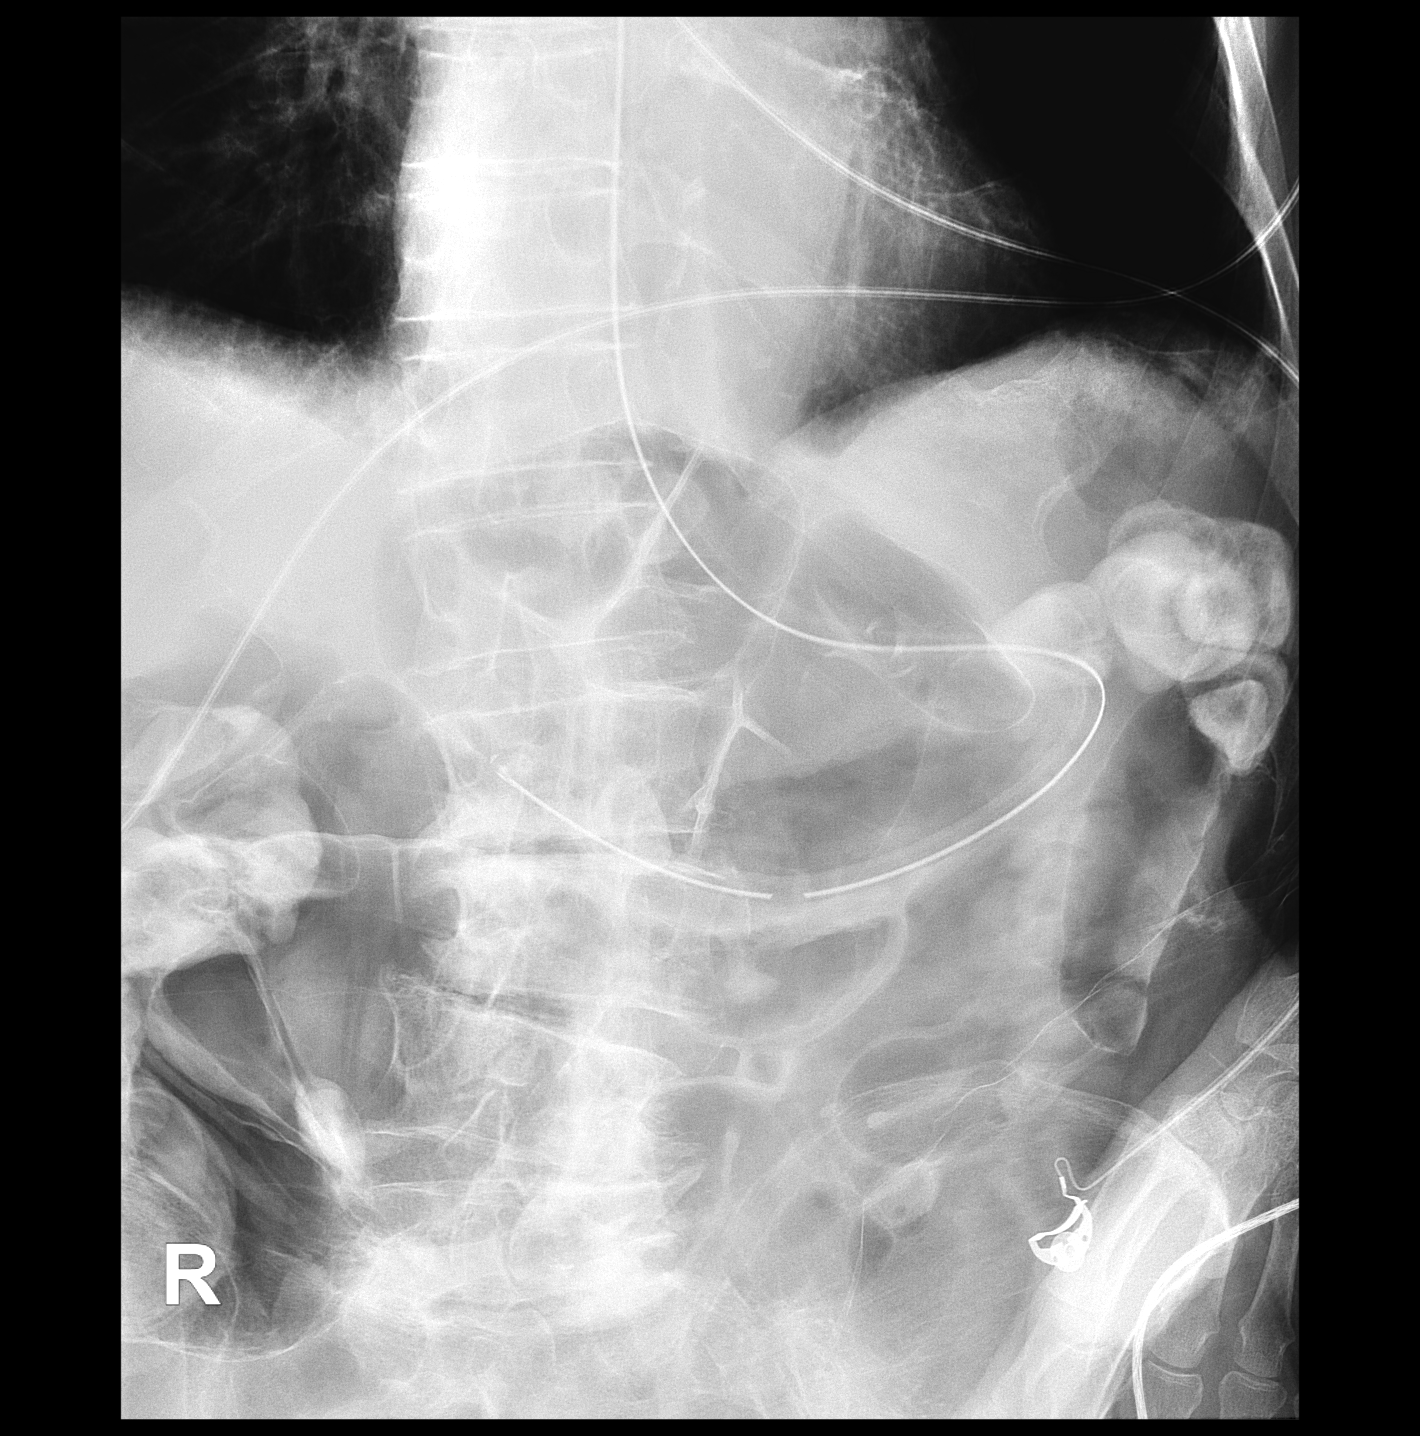

[1 of 1 positions shown; findings below may reference images not displayed]

FINDINGS: Enteric tube terminates in the distal stomach.
IMPRESSION: Enteric tube terminates in the distal stomach.
